# Patient Record
Sex: Female | Born: 1980 | Hispanic: No | Marital: Married | Smoking: Never smoker
Health system: Southern US, Community
[De-identification: ages and names within clinical notes are randomized; demographics above are authoritative.]

## PROBLEM LIST (undated history)

## (undated) DIAGNOSIS — Z789 Other specified health status: Secondary | ICD-10-CM

## (undated) HISTORY — PX: NO PAST SURGERIES: SHX2092

---

## 2010-10-20 HISTORY — PX: WISDOM TOOTH EXTRACTION: SHX21

## 2014-04-30 ENCOUNTER — Encounter (HOSPITAL_COMMUNITY): Payer: Self-pay | Admitting: *Deleted

## 2014-04-30 ENCOUNTER — Inpatient Hospital Stay (HOSPITAL_COMMUNITY): Payer: BC Managed Care – PPO

## 2014-04-30 ENCOUNTER — Inpatient Hospital Stay (HOSPITAL_COMMUNITY)
Admission: AD | Admit: 2014-04-30 | Discharge: 2014-04-30 | Disposition: A | Discharge status: Home / Self Care | Payer: BC Managed Care – PPO | Source: Ambulatory Visit | Attending: Obstetrics & Gynecology | Admitting: Obstetrics & Gynecology

## 2014-04-30 DIAGNOSIS — O2390 Unspecified genitourinary tract infection in pregnancy, unspecified trimester: Secondary | ICD-10-CM | POA: Diagnosis not present

## 2014-04-30 DIAGNOSIS — O039 Complete or unspecified spontaneous abortion without complication: Secondary | ICD-10-CM | POA: Insufficient documentation

## 2014-04-30 DIAGNOSIS — N390 Urinary tract infection, site not specified: Secondary | ICD-10-CM | POA: Diagnosis not present

## 2014-04-30 LAB — URINALYSIS, ROUTINE W REFLEX MICROSCOPIC
BILIRUBIN URINE: NEGATIVE
Glucose, UA: 100 mg/dL — AB
KETONES UR: 40 mg/dL — AB
Nitrite: POSITIVE — AB
PH: 7 (ref 5.0–8.0)
Protein, ur: >300 mg/dL — AB
SPECIFIC GRAVITY, URINE: 1.015 (ref 1.005–1.030)
Urobilinogen, UA: >8 mg/dL — ABNORMAL HIGH (ref 0.0–1.0)

## 2014-04-30 LAB — URINE MICROSCOPIC-ADD ON

## 2014-04-30 LAB — CBC
HEMATOCRIT: 32.7 % — AB (ref 36.0–46.0)
Hemoglobin: 11 g/dL — ABNORMAL LOW (ref 12.0–15.0)
MCH: 28.4 pg (ref 26.0–34.0)
MCHC: 33.6 g/dL (ref 30.0–36.0)
MCV: 84.5 fL (ref 78.0–100.0)
Platelets: 391 10*3/uL (ref 150–400)
RBC: 3.87 MIL/uL (ref 3.87–5.11)
RDW: 13.3 % (ref 11.5–15.5)
WBC: 12.4 10*3/uL — ABNORMAL HIGH (ref 4.0–10.5)

## 2014-04-30 LAB — POCT PREGNANCY, URINE: Preg Test, Ur: POSITIVE — AB

## 2014-04-30 LAB — HCG, QUANTITATIVE, PREGNANCY: hCG, Beta Chain, Quant, S: 5560 m[IU]/mL — ABNORMAL HIGH (ref <5)

## 2014-04-30 LAB — ABO/RH: ABO/RH(D): AB POS

## 2014-04-30 MED ORDER — OXYCODONE-ACETAMINOPHEN 5-325 MG PO TABS
2.0000 | ORAL_TABLET | ORAL | Status: DC | PRN
Start: 2014-04-30 — End: 2014-04-30

## 2014-04-30 MED ORDER — PROMETHAZINE HCL 25 MG PO TABS: 25.0000 mg | ORAL_TABLET | Freq: Four times a day (QID) | ORAL | Status: DC | PRN

## 2014-04-30 MED ORDER — KETOROLAC TROMETHAMINE 60 MG/2ML IM SOLN
60.0000 mg | Freq: Once | INTRAMUSCULAR | Status: AC
  Administered 2014-04-30: 60 mg via INTRAMUSCULAR
  Filled 2014-04-30: qty 2

## 2014-04-30 MED ORDER — CEPHALEXIN 500 MG PO CAPS: 500.0000 mg | ORAL_CAPSULE | Freq: Four times a day (QID) | ORAL | Status: DC

## 2014-04-30 MED ORDER — OXYCODONE-ACETAMINOPHEN 5-325 MG PO TABS: 2.0000 | ORAL_TABLET | ORAL | Status: DC | PRN

## 2014-04-30 NOTE — MAU Provider Note (Signed)
History     CSN: [id number]  Arrival date and time: 04/30/14 1014   First Provider Initiated Contact with Patient 04/30/14 1124      Chief Complaint  Patient presents with  . Vaginal Bleeding   HPI Comments: Valerie Hansen 33 y.o. G1P0  [redacted] weeks pregnant presents to MAU with vaginal bleeding off and on since last Friday. Today the bleeding became very heavy and she was changing a pad every 15 minutes. She had a fainting episode in [place] and was put in room. IV started, BP 88/60 and she was diaphoretic. She denies pain.      History reviewed. No pertinent past medical history.  History reviewed. No pertinent past surgical history.  History reviewed. No pertinent family history.  History  Substance Use Topics  . Smoking status: Never Smoker   . Smokeless tobacco: Never Used  . Alcohol Use: No    Allergies: Allergies not on file  No prescriptions prior to admission    Review of Systems  Constitutional: Positive for malaise/fatigue and diaphoresis.  HENT: Negative.   Eyes: Negative.   Respiratory: Negative.   Cardiovascular: Negative.   Gastrointestinal: Negative.   Genitourinary: Negative.        Vaginal bleeding  Musculoskeletal: Negative.   Neurological: Positive for weakness.  Psychiatric/Behavioral: Negative.    Physical Exam   Blood pressure 108/84, pulse 125, temperature 98.1 F (36.7 C), temperature source Oral, resp. rate 18, height 5\' 1"  (1.549 m), weight 70.761 kg (156 lb), last menstrual period 02/21/2014.  Physical Exam  Constitutional: She is oriented to person, place, and time. She appears well-developed and well-nourished.  Fainted, diaphoretic,   Respiratory: Effort normal and breath sounds normal.  GI: Soft. Bowel sounds are normal.  Genitourinary:  Cervix: os open passing POC Stopped exam to give pain medications  After Toradol large clots and bleeding Os open 1 cm  2:45 os open / blood less  Musculoskeletal: Normal range of motion.   Neurological: She is alert and oriented to person, place, and time.  Skin: Skin is warm and dry.  Psychiatric: She has a normal mood and affect. Her behavior is normal. Judgment and thought content normal.   Results for orders placed during the hospital encounter of 04/30/14 (from the past 24 hour(s))  URINALYSIS, ROUTINE W REFLEX MICROSCOPIC     Status: Abnormal   Collection Time    04/30/14 10:45 AM      Result Value Ref Range   Color, Urine RED (*) YELLOW   APPearance TURBID (*) CLEAR   Specific Gravity, Urine 1.015  1.005 - 1.030   pH 7.0  5.0 - 8.0   Glucose, UA 100 (*) NEGATIVE mg/dL   Hgb urine dipstick LARGE (*) NEGATIVE   Bilirubin Urine NEGATIVE  NEGATIVE   Ketones, ur 40 (*) NEGATIVE mg/dL   Protein, ur >409 (*) NEGATIVE mg/dL   Urobilinogen, UA >8.1 (*) 0.0 - 1.0 mg/dL   Nitrite POSITIVE (*) NEGATIVE   Leukocytes, UA MODERATE (*) NEGATIVE  URINE MICROSCOPIC-ADD ON     Status: None   Collection Time    04/30/14 10:45 AM      Result Value Ref Range   WBC, UA 7-10  <3 WBC/hpf   RBC / HPF TOO NUMEROUS TO COUNT  <3 RBC/hpf   Bacteria, UA RARE  RARE   Urine-Other MUCOUS PRESENT    POCT PREGNANCY, URINE     Status: Abnormal   Collection Time    04/30/14 10:52 AM  Result Value Ref Range   Preg Test, Ur POSITIVE (*) NEGATIVE  CBC     Status: Abnormal   Collection Time    04/30/14 11:00 AM      Result Value Ref Range   WBC 12.4 (*) 4.0 - 10.5 K/uL   RBC 3.87  3.87 - 5.11 MIL/uL   Hemoglobin 11.0 (*) 12.0 - 15.0 g/dL   HCT 78.2 (*) 95.6 - 21.3 %   MCV 84.5  78.0 - 100.0 fL   MCH 28.4  26.0 - 34.0 pg   MCHC 33.6  30.0 - 36.0 g/dL   RDW 08.6  57.8 - 46.9 %   Platelets 391  150 - 400 K/uL  HCG, QUANTITATIVE, PREGNANCY     Status: Abnormal   Collection Time    04/30/14 11:00 AM      Result Value Ref Range   hCG, Beta Chain, Quant, S 5560 (*) <5 mIU/mL  ABO/RH     Status: None   Collection Time    04/30/14 11:00 AM      Result Value Ref Range   ABO/RH(D) AB  POS       US Ob Comp Less 14 Wks  04/30/2014   CLINICAL DATA:  Heavy bleeding. Beta HCG wall 5,500. Estimated gestational age by last menstrual period equals 9 weeks 5 days.  EXAM: OBSTETRIC <14 WK [place] AND TRANSVAGINAL OB US  TECHNIQUE: Both transabdominal and transvaginal ultrasound examinations were performed for complete evaluation of the gestation as well as the maternal uterus, adnexal regions, and pelvic cul-de-sac. Transvaginal technique was performed to assess early pregnancy.  COMPARISON:  None.  FINDINGS: Intrauterine gestational sac: Not identified  Yolk sac:  Not identified  Embryo:  Not identified  Maternal uterus/adnexae: There several fluid collections within the endometrial canal and some are complex echogenic foci within the endometrial canal suggesting products of conception.  Ovaries are normal.  No free fluid.  IMPRESSION: 1. Findings are most suggestive of spontaneous abortion in progress. There is no intrauterine gestational sac identified. There is fluid and some echogenic material within the endometrial canal suggesting products of conception. 2. Ovaries are normal. 3. No free fluid.   Electronically Signed   By: Genevive Bi M.D.   On: 04/30/2014 13:37   US Ob Transvaginal  04/30/2014   CLINICAL DATA:  Heavy bleeding. Beta HCG wall 5,500. Estimated gestational age by last menstrual period equals 9 weeks 5 days.  EXAM: OBSTETRIC <14 WK [place] AND TRANSVAGINAL OB US  TECHNIQUE: Both transabdominal and transvaginal ultrasound examinations were performed for complete evaluation of the gestation as well as the maternal uterus, adnexal regions, and pelvic cul-de-sac. Transvaginal technique was performed to assess early pregnancy.  COMPARISON:  None.  FINDINGS: Intrauterine gestational sac: Not identified  Yolk sac:  Not identified  Embryo:  Not identified  Maternal uterus/adnexae: There several fluid collections within the endometrial canal and some are complex echogenic foci within the  endometrial canal suggesting products of conception.  Ovaries are normal.  No free fluid.  IMPRESSION: 1. Findings are most suggestive of spontaneous abortion in progress. There is no intrauterine gestational sac identified. There is fluid and some echogenic material within the endometrial canal suggesting products of conception. 2. Ovaries are normal. 3. No free fluid.   Electronically Signed   By: Genevive Bi M.D.   On: 04/30/2014 13:37     MAU Course  Procedures  MDM Wet prep, GC, Chlamydia, CBC, UA, U/S, ABORh, Quant Toradol 60 mg IM/  only has pain with exam 2 liters LR Urine culture [place] take [place] there for the week   Assessment and Plan   A: SAB UTI  P: Keflex 500 mg QID X 7 days Percocet/ phenergan Fluids/ rest Note for work for week Return to Hospital for excessive pain/ bleeding more than pad/ hour  Carolynn ServeBarefoot, Yvon Mccord Miller 04/30/2014, 11:24 AM

## 2014-04-30 NOTE — Discharge Instructions (Signed)
Urinary Tract Infection Urinary tract infections (UTIs) can develop anywhere along your urinary tract. Your urinary tract is your body's drainage system for removing wastes and extra water. Your urinary tract includes two kidneys, two ureters, a bladder, and a urethra. Your kidneys are a pair of bean-shaped organs. Each kidney is about the size of your fist. They are located below your ribs, one on each side of your spine. CAUSES Infections are caused by microbes, which are microscopic organisms, including fungi, viruses, and bacteria. These organisms are so small that they can only be seen through a microscope. Bacteria are the microbes that most commonly cause UTIs. SYMPTOMS  Symptoms of UTIs may vary by age and gender of the patient and by the location of the infection. Symptoms in young women typically include a frequent and intense urge to urinate and a painful, burning feeling in the bladder or urethra during urination. Older women and men are more likely to be tired, shaky, and weak and have muscle aches and abdominal pain. A fever may mean the infection is in your kidneys. Other symptoms of a kidney infection include pain in your back or sides below the ribs, nausea, and vomiting. DIAGNOSIS To diagnose a UTI, your caregiver will ask you about your symptoms. Your caregiver also will ask to provide a urine sample. The urine sample will be tested for bacteria and white blood cells. White blood cells are made by your body to help fight infection. TREATMENT  Typically, UTIs can be treated with medication. Because most UTIs are caused by a bacterial infection, they usually can be treated with the use of antibiotics. The choice of antibiotic and length of treatment depend on your symptoms and the type of bacteria causing your infection. HOME CARE INSTRUCTIONS  If you were prescribed antibiotics, take them exactly as your caregiver instructs you. Finish the medication even if you feel better after you  have only taken some of the medication.  Drink enough water and fluids to keep your urine clear or pale yellow.  Avoid caffeine, tea, and carbonated beverages. They tend to irritate your bladder.  Empty your bladder often. Avoid holding urine for long periods of time.  Empty your bladder before and after sexual intercourse.  After a bowel movement, women should cleanse from front to back. Use each tissue only once. SEEK MEDICAL CARE IF:   You have back pain.  You develop a fever.  Your symptoms do not begin to resolve within 3 days. SEEK IMMEDIATE MEDICAL CARE IF:   You have severe back pain or lower abdominal pain.  You develop chills.  You have nausea or vomiting.  You have continued burning or discomfort with urination. MAKE SURE YOU:   Understand these instructions.  Will watch your condition.  Will get help right away if you are not doing well or get worse. Document Released: 07/16/2005 Document Revised: 04/06/2012 Document Reviewed: 11/14/2011 [facility] Patient Information 2015 [place]. This information is not intended to replace advice given to you by your health care provider. Make sure you discuss any questions you have with your health care provider. Miscarriage A miscarriage is the sudden loss of an unborn baby (fetus) before the 20th week of pregnancy. Most miscarriages happen in the first 3 months of pregnancy. Sometimes, it happens before a woman even knows she is pregnant. A miscarriage is also called a "spontaneous miscarriage" or "early pregnancy loss." Having a miscarriage can be an emotional experience. Talk with your caregiver about any questions you  may have about miscarrying, the grieving process, and your future pregnancy plans. CAUSES   Problems with the fetal chromosomes that make it impossible for the baby to develop normally. Problems with the baby's genes or chromosomes are most often the result of errors that occur, by chance, as the embryo  divides and grows. The problems are not inherited from the parents.  Infection of the cervix or uterus.   Hormone problems.   Problems with the cervix, such as having an incompetent cervix. This is when the tissue in the cervix is not strong enough to hold the pregnancy.   Problems with the uterus, such as an abnormally shaped uterus, uterine fibroids, or congenital abnormalities.   Certain medical conditions.   Smoking, drinking alcohol, or taking illegal drugs.   Trauma.  Often, the cause of a miscarriage is unknown.  SYMPTOMS   Vaginal bleeding or spotting, with or without cramps or pain.  Pain or cramping in the abdomen or lower back.  Passing fluid, tissue, or blood clots from the vagina. DIAGNOSIS  Your caregiver will perform a physical exam. You may also have an ultrasound to confirm the miscarriage. Blood or urine tests may also be ordered. TREATMENT   Sometimes, treatment is not necessary if you naturally pass all the fetal tissue that was in the uterus. If some of the fetus or placenta remains in the body (incomplete miscarriage), tissue left behind may become infected and must be removed. Usually, a dilation and curettage (D and C) procedure is performed. During a D and C procedure, the cervix is widened (dilated) and any remaining fetal or placental tissue is gently removed from the uterus.  Antibiotic medicines are prescribed if there is an infection. Other medicines may be given to reduce the size of the uterus (contract) if there is a lot of bleeding.  If you have Rh negative blood and your baby was Rh positive, you will need a Rh immunoglobulin shot. This shot will protect any future baby from having Rh blood problems in future pregnancies. HOME CARE INSTRUCTIONS   Your caregiver may order bed rest or may allow you to continue light activity. Resume activity as directed by your caregiver.  Have someone help with home and family responsibilities during this  time.   Keep track of the number of sanitary pads you use each day and how soaked (saturated) they are. Write down this information.   Do not use tampons. Do not douche or have sexual intercourse until approved by your caregiver.   Only take over-the-counter or prescription medicines for pain or discomfort as directed by your caregiver.   Do not take aspirin. Aspirin can cause bleeding.   Keep all follow-up appointments with your caregiver.   If you or your partner have problems with grieving, talk to your caregiver or seek counseling to help cope with the pregnancy loss. Allow enough time to grieve before trying to get pregnant again.  SEEK IMMEDIATE MEDICAL CARE IF:   You have severe cramps or pain in your back or abdomen.  You have a fever.  You pass large blood clots (walnut-sized or larger) ortissue from your vagina. Save any tissue for your caregiver to inspect.   Your bleeding increases.   You have a thick, bad-smelling vaginal discharge.  You become lightheaded, weak, or you faint.   You have chills.  MAKE SURE YOU:  Understand these instructions.  Will watch your condition.  Will get help right away if you are not doing well  or get worse. Document Released: 04/01/2001 Document Revised: 01/31/2013 Document Reviewed: 11/25/2011 [facility] Patient Information 2015 [place], [place]. This information is not intended to replace advice given to you by your health care provider. Make sure you discuss any questions you have with your health care provider.

## 2014-04-30 NOTE — MAU Note (Signed)
Woke up with blood in bed this morning, more when went to restroom.  Not time of period, had +HPT in June

## 2014-05-01 LAB — CULTURE, OB URINE
COLONY COUNT: NO GROWTH
Culture: NO GROWTH

## 2014-05-08 ENCOUNTER — Ambulatory Visit (INDEPENDENT_AMBULATORY_CARE_PROVIDER_SITE_OTHER): Payer: BC Managed Care – PPO | Admitting: Obstetrics & Gynecology

## 2014-05-08 ENCOUNTER — Encounter: Payer: Self-pay | Admitting: Obstetrics & Gynecology

## 2014-05-08 VITALS — BP 108/71 | HR 100 | Temp 98.5°F | Wt 161.5 lb

## 2014-05-08 DIAGNOSIS — O039 Complete or unspecified spontaneous abortion without complication: Secondary | ICD-10-CM

## 2014-05-08 NOTE — Progress Notes (Signed)
   Subjective:    Patient ID: Valerie Hansen, female    DOB: 05/11/1981, 33 y.o.   MRN: [id number]  HPI  33 yo [place] M G1P0A1 is here for follow up after a MAU visit 04-30-14 where she was diagnosed with a complete miscarriage. She bled heavily for 3 days but now only has some spotting. She denies any pain. She was treated for a UTI at the MAU also. She says that she has no dysuria but did not finish quite all of her antibiotics.  Review of Systems [place]. [place] is now back in [place] and she is filling out paperwork to have him come to the [place]. But he will not be here for quite some time so she says that she does not need contraception.    Objective:   Physical Exam  No CVAT     Assessment & Plan:  Preventative care- RTC 6 weeks for annual/pap Miscarriage- [facility] and in 6 weeks

## 2014-05-09 LAB — HCG, QUANTITATIVE, PREGNANCY: hCG, Beta Chain, Quant, S: 5497.2 m[IU]/mL

## 2014-05-15 ENCOUNTER — Telehealth: Payer: Self-pay | Admitting: *Deleted

## 2014-05-15 ENCOUNTER — Encounter: Payer: Self-pay | Admitting: *Deleted

## 2014-05-15 DIAGNOSIS — O039 Complete or unspecified spontaneous abortion without complication: Secondary | ICD-10-CM

## 2014-05-15 NOTE — Telephone Encounter (Signed)
Message copied by Dorothyann PengHAIZLIP, Isaia Hassell E on Mon May 15, 2014  1:06 PM ------      Message from: Nicholaus BloomVE, MYRA C      Created: Mon May 15, 2014  9:55 AM       She needs an u/s, a repeat QBHCG, and  an appt in the gyn clinic with any provider asap.      Thanks. ------

## 2014-05-15 NOTE — Telephone Encounter (Signed)
Pt has an ultrasound appointment on May 18, 2014 @ 1115.   Attempted to contact patient with the aide of [employer], unable to reach patient, unable to leave voicemail.  Will send certified letter with appointment date/time.  Letter sent.

## 2014-05-17 ENCOUNTER — Encounter: Payer: Self-pay | Admitting: General Practice

## 2014-05-18 ENCOUNTER — Other Ambulatory Visit: Payer: BC Managed Care – PPO

## 2014-05-18 ENCOUNTER — Ambulatory Visit (HOSPITAL_COMMUNITY): Payer: BC Managed Care – PPO

## 2014-05-18 DIAGNOSIS — O039 Complete or unspecified spontaneous abortion without complication: Secondary | ICD-10-CM

## 2014-05-19 LAB — HCG, QUANTITATIVE, PREGNANCY: HCG, BETA CHAIN, QUANT, S: 2440.7 m[IU]/mL

## 2014-05-22 ENCOUNTER — Telehealth: Payer: Self-pay | Admitting: *Deleted

## 2014-05-22 NOTE — Telephone Encounter (Signed)
Message copied by Dorothyann PengHAIZLIP, Tahja Liao E on Mon May 22, 2014  8:43 AM ------      Message from: CONSTANT, Gigi GinPEGGY      Created: Fri May 19, 2014  7:32 AM       Patient needs to return next week for repeat quant HCG ------

## 2014-05-22 NOTE — Telephone Encounter (Signed)
Contacted patient, discussed results and lab appointment scheduled for Thursday August 6,2015 @ 0900 for bhcg.

## 2014-05-25 ENCOUNTER — Other Ambulatory Visit: Payer: BC Managed Care – PPO

## 2014-05-25 DIAGNOSIS — O039 Complete or unspecified spontaneous abortion without complication: Secondary | ICD-10-CM

## 2014-05-25 LAB — HCG, QUANTITATIVE, PREGNANCY: HCG, BETA CHAIN, QUANT, S: 2268.6 m[IU]/mL

## 2014-06-16 ENCOUNTER — Encounter: Payer: Self-pay | Admitting: General Practice

## 2014-06-19 ENCOUNTER — Encounter: Payer: Self-pay | Admitting: Obstetrics & Gynecology

## 2014-06-19 ENCOUNTER — Ambulatory Visit (INDEPENDENT_AMBULATORY_CARE_PROVIDER_SITE_OTHER): Payer: BC Managed Care – PPO | Admitting: Obstetrics & Gynecology

## 2014-06-19 VITALS — BP 104/63 | HR 76 | Wt 155.5 lb

## 2014-06-19 DIAGNOSIS — Z01419 Encounter for gynecological examination (general) (routine) without abnormal findings: Secondary | ICD-10-CM

## 2014-06-19 NOTE — Patient Instructions (Signed)
Prenatal Vitamin and Mineral Combinations (oral solid dosage forms) What is this medicine? PRENATAL VITAMIN AND MINERAL combinations are used before, during, and after pregnancy to help provide provide good nutrition. This medicine may be used for other purposes; ask your health care provider or pharmacist if you have questions. COMMON BRAND NAME(S): Active OB, Advanced Care Plus, Advanced NatalCare, Advanced-RF NatalCare, Aminate Fe, Anemagen OB, B-Nexa, BP FoliNatal Plus B, BP MultiNatal Plus, BP MultiNatal Plus Chewable, BP Prenate, Brainstrong, Bright Beginnings Prenatal, Cal-Nate, Calcium PNV, CareNatal DHA, CareNate 600, Cavan One Omega, Cavan Prenatal with EC Calcium, Cavan-Alpha, Cavan-EC SOD DHA, Cavan-Heme OB, Cavan-Heme Omega, Cenogen Ultra, Centrum Specialist Prenatal, CertaVite with Antioxidants, Choice-OB + DHA, Citracal Prenatal, Citracal Prenatal + DHA, CitraNatal 90 DHA, CitraNatal Assure, CitraNatal B-Calm, CitraNatal DHA, CitraNatal Harmony, CitraNatal Rx, Classic Prenatal, ComBi Rx, Complete Natal DHA, Complete-RF, CompleteNate, Concept DHA, Concept OB, CoreNate-DHA, Corvite FE with Quatrefolic, CRNatal DHA, Daily Vitamin, Docosavit, Duet, Duet Chewable, Duet DHA, Duet DHA 400, Duet DHA 430ec, Duet DHA Balanced, Duet DHA Complete, Duet DHA Complete Gluten Free, Duet DHA EC, Duet DHA Ferrazone, EC Omega-3, DuoVit DHA, Edge OB, Elite OB, Elite OB with DHA, Elite-OB 400, Extra-Virt Plus, Femecal OB, Femecal OB Plus DHA, Ferrocite Plus, Folbecal, Folcal DHA, Folcaps Care One, Folcaps Omega 3, Folet One with Quatrefolic, Folivane OB, Folivane-EC Calcium DHA NF, Foltabs 90 Plus DHA, Foltabs Prenatal, Foltabs Prenatal Plus DHA, Gesticare, Gesticare DHA, Gesticare DHA Delayed-Release, HemeNatal OB, HemeNatal OB + DHA, Hemocyte Plus, HIP Prenatal, ICAR Prenatal Rx, iNatal Advance, iNatal GT, iNatal Ultra, Infanate Balance, Infanate DHA, Infanate Plus, Kolnatal, Lactocal-F, Levomefolate PNV, MACNATAL  CN DHA, Marnatal-F, Marnatal-F Plus, Materna, Maxinate, Mission Prenatal, Mission Prenatal F.A., Mission Prenatal H.P., Mom's Choice Rx, Multi-Nate 30, Multi-Nate 30 DHA, Multi-Nate DHA Extra, Multifol Plus, Multivitamin With Minerals, MyNatal OB Prenatal, NataCaps, NataChew, NataFolic-OB, Natafort, Natal-V RX, NatalCare CFe 60, NatalCare GlossTabs, NatalCare PIC, NatalCare PIC Forte, NatalCare Plus, NatalCare Rx, NatalCare Three, NATALVIRT 90 DHA, NATALVIRT CA, NataTab CFe, NataTab FA, Natatab Rx, Natelle, Natelle C, Natelle One, Natelle Plus with DHA, Natelle Prefer, Natelle-ez, Navatab + DHA, Neevo, Neevo DHA, Nestabs, Nestabs ABC, Nestabs CBF, Nestabs DHA, Nestabs FA, Nestabs Rx, New Advanced Formula Prenatal Z, Nexa Plus, Nexa Select, Niferex-PN, Niferex-PN Forte, NovaNatal, NovaStart, Nu-Natal, NutraCare, Nutri-Tab OB, Nutri-Tab OB + DHA, Nutrinate, NutriSpire, O-Cal F.A., O-Cal Prenatal, OB Choice, OB Complete, OB Complete 400, OB Complete One, OB Complete Petite, OB Complete Premier, OB Complete with DHA, OB-Natal One, Obstetrix-100, Obtrex, Obtrex DHA, One-A-Day Women's, One-A-Day Women's Prenatal, OptiNate, Paire OB Tablet Plus DHA, PNV OB + DHA, PNV Prenatal, PNV Prenatal Plus Multivitamin, PNV Tabs 29-1, PNV-DHA, PNV-DHA + Docusate, PNV-DHA Plus, PNV-First, PNV-Iron, PNV-OB with DHA, PNV-Omega, PNV-Select, PNV-Total with DHA, PNV-VP-U, PR Natal 400, PR Natal 400ec, PR Natal 430, PR Natal 430ec, PR Natal 440ec, PreCare, PreferaOB, PreferaOB + DHA, PreferaOB One, Premesis Rx, Prena1 PEARL, Prena1 Plus, PrenaCare, Prenafirst, Prenaissance, Prenaissance 90 DHA, Prenaissance Balance, Prenaissance DHA, Prenaissance Harmony DHA, Prenaissance Next, Prenaissance Plus, Prenaissance Promise, PrenaPlus, Prenat with Quatrefolic, PreNata Multivitamin with Iron, Prenatabs CBF, Prenatabs FA, Prenatabs OBN, Prenatabs RX, Prenatal, Prenatal 1 Plus 1, Prenatal 19, Prenatal AD, Prenatal Formula 3, Prenatal H, Prenatal Low  Iron, Prenatal MR 90 Fe, Prenatal MTR with Selenium, Prenatal Multivitamin + DHA 2, Prenatal Optima Advance, Prenatal Plus, Prenatal Plus Iron, Prenatal Plus Low Iron, Prenatal Rx with Beta Carotene, Prenatal S, Prenatal U, Prenatal Vitamin, PreNatal Vitamins Plus, Prenate Advance, Prenate AM with Quatrefolic, Prenate DHA, Prenate Elite, Prenate   Enhance with Quatrefolic, Prenate Essential, Prenate GT, Prenate Mini, PreNate Plus, Prenate Restore with Quatrefolic, Prenate Star, Prenate Ultra, Prenavite, Prenavite Protein, PreNexa, PreNexa Premier, PrePLUS, PreQue, PreTAB, Previte Rx, PrimaCare, PrimaCare Advantage, PrimaCare ONE, Provida OB, PruEt DHA, PruEt DHAec, PureFe OB Plus, PureFe Plus, PureVit DualFe Plus, RE DualVit OB, RE DualVit Plus, RE OB + DHA, RE OB 90 + DHA, RE Prenatal, RE PreVit + DHA, RE-Nata 29, RE-Nata 29 OB, Reaphrim, Renate, Renate DHA, Renate DHA Extra, REocyte Plus, Right Step, Rovin-Nv, Rovin-Nv DHA, Se-Care, Se-Care Conceive, Se-Care Gesture, Se-Natal 19, Se-Natal 19 Chewable, Se-Natal 90, Se-Natal ONE, Se-Plete DHA, Se-Tan DHA, Se-Tan Plus, Select-OB, Select-OB + DHA, SetonET, SetonET-EC DHA, StrongStart, StrongStart Chewable Tablet, Stuart One, Stuart Prenatal, Stuart Prenatal + DHA, Stuartnatal Plus 3, Tandem DHA, Tandem OB, Tandem Plus, Taron A Prenatal Pack with DHA, Taron EC Calcium DHA Pack, Taron Prenatal with DHA, Taron-C DHA, Taron-EC Cal, Taron-Prex Prenatal with DHA, Thera Natal Complete, Thera Natal Core Nutrition, Thera Natal Lactation Support, Thera Natal OvaVite, Thera Natal Plus, Thera-Tabs, TL-Care DHA, TL-Select, TL-Select DHA, Tri Rx, TriAdvance, TriCare, TriCare Prenatal DHA ONE, Trifera OB, Trimesis Rx, Trinatal GT, Trinatal Rx 1, Trinate, Triveen-Duo DHA, Triveen-PRx RNF, Triveen-Ten, Trust Natal DHA, UltimateCare Advantage, UltimateCare Combo, UltimateCare ONE, UltimateCare ONE NF, Ultra NatalCare, VemaVite-PRx 2, Vena-Bal DHA, Venatal-FA, Verotin-BY, Verotin-GR,  Vinacal, Vinacal B, Vinatal Forte, Vinate 90, Vinate AZ, Vinate AZ Extra, Vinate C, Vinate Calcium, Vinate Care, Vinate DHA, Vinate GT, Vinate IC, Vinate II, Vinate III, Vinate M Low Iron, Vinate One, Vinate PN, Vinate Ultra, Virt Nate, Virt-Advance, Virt-C DHA, Virt-Care One, Virt-PN, Virt-PN DHA, Virt-PN Plus, Virt-Select, Virt-Vite GT, VirtPrex, Vitafol PN, Vitafol Ultra, Vitafol-Nano Prenatal, Vitafol-OB, Vitafol-OB + DHA, Vitafol-OB and DHA, Vitafol-One, VitaMed MD Plus Rx, VitaNatal OB Plus DHA, vitaPearl Prenatal, VitaPhil, VitaPhil + DHA, VitaPhil + DHA 90, VitaPhil AiDE, VitaSpire, Viva CT, VIVA DHA, Vol-Tab Rx, VP CH Ultra, VP-CH-PNV, VP-GGR-B6 Prenatal, VP-HEME OB, VP-HEME OB+ DHA, VP-PNV-DHA, Zatean-CH, Zatean-Pn, Zatean-Pn DHA, Zatean-Pn Plus, Zingiber What should I tell my health care provider before I take this medicine? They need to know if you have any of these conditions: -bleeding or clotting disorder -history of anemia of any type -other chronic health condition -an unusual or allergic reaction to vitamins, minerals, other medicines, foods, dyes, or preservatives How should I use this medicine? Take this medicine by mouth with a glass of water. You can take it with or without food. If it upsets your stomach, take it with food. Chewable prenatal vitamin tablets may be chewed completely before swallowing. Follow the directions on the prescription label. The usual dose is taken once a day. Do not take your medicine more often than directed. Contact your pediatrician regarding the use of this medicine in children. Special care may be needed. This medicine is intended for females who are pregnant, breast-feeding, or may become pregnant. Overdosage: If you think you have taken too much of this medicine contact a poison control center or emergency room at once. NOTE: This medicine is only for you. Do not share this medicine with others. What if I miss a dose? If you miss a dose, take it as  soon as you can. If it is almost time for your next dose, take only that dose. Do not take double or extra doses. What may interact with this medicine? -alendronate -antacids -cefdinir -cefditoren -etidronate -fluoroquinolone antibiotics (examples: ciprofloxacin, gatifloxacin, levofloxacin) -ibandronate -levodopa -risedronate -tetracycline antibiotics (examples: doxycycline, minocycline, tetracycline) -thyroid hormones -warfarin This list may not describe   all possible interactions. Give your health care provider a list of all the medicines, herbs, non-prescription drugs, or dietary supplements you use. Also tell them if you smoke, drink alcohol, or use illegal drugs. Some items may interact with your medicine. What should I watch for while using this medicine? See your health care professional for regular checks on your progress. Remember that vitamin and mineral supplements do not replace the need for good nutrition from a balanced diet. Stools commonly change color when vitamins and minerals are taken. Notify your health care professional if this change is alarming or accompanied by other symptoms, like abdominal pain. What side effects may I notice from receiving this medicine? Side effects that you should report to your doctor or health care professional as soon as possible: -allergic reaction such as skin rash or difficulty breathing -vomiting Side effects that usually do not require medical attention (report to your doctor or health care professional if they continue or are bothersome): -nausea -stomach upset This list may not describe all possible side effects. Call your doctor for medical advice about side effects. You may report side effects to FDA at 1-800-FDA-1088. Where should I keep my medicine? Keep out of the reach of children. Most vitamins and minerals should be stored at controlled room temperature. Check your specific product directions. Protect from heat and moisture.  Throw away any unused medicine after the expiration date. NOTE: This sheet is a summary. It may not cover all possible information. If you have questions about this medicine, talk to your doctor, pharmacist, or health care provider.  2015, Elsevier/Gold Standard. (2014-01-31 08:34:45)  

## 2014-06-19 NOTE — Progress Notes (Signed)
Here to followup on SAB needs labs and for a pap smear.

## 2014-06-19 NOTE — Addendum Note (Signed)
Addended by: Kathee Delton on: 06/19/2014 02:37 PM   Modules accepted: Orders

## 2014-06-19 NOTE — Progress Notes (Signed)
Patient ID: Valerie Hansen, female   DOB: October 31, 1980, 33 y.o.   MRN: [id number] Subjective:     Valerie Hansen is a 33 y.o. female here for a routine exam.  Current complaints: none.  Pt reports SAB in July. She had a normal cycle that was heavier than usual in Aug.  She reports no further complaints. No irreg bleeding or pain.   Gynecologic History Patient's last menstrual period was 02/21/2014. Contraception: abstinence (her [place] is out of the country) Last Pap: 2 years prev. Results were: normal Last mammogram: n/a.   Obstetric History OB History  Gravida Para Term Preterm AB SAB TAB Ectopic Multiple Living  0    # Outcome Date GA Lbr Len/2nd Weight Sex Delivery Anes PTL Lv  1 SAB                The following portions of the patient's history were reviewed and updated as appropriate: allergies, current medications, past family history, past medical history, past social history, past surgical history and problem list.  Review of Systems A comprehensive review of systems was negative.    Objective:    BP 104/63  Pulse 76  Wt 155 lb 8 oz (70.534 kg)  LMP 02/21/2014  Breastfeeding? Unknown  General Appearance:    Alert, cooperative, no distress, appears stated age  Head:    Normocephalic, without obvious abnormality, atraumatic           Throat:   Lips, mucosa, and tongue normal; teeth and gums normal  Neck:   Supple, symmetrical, trachea midline, no adenopathy;    thyroid:  no enlargement/tenderness/nodules; no carotid   bruit or JVD  Back:     Symmetric, no curvature, ROM normal, no CVA tenderness  Lungs:     Clear to auscultation bilaterally, respirations unlabored  Chest Wall:    No tenderness or deformity   Heart:    Regular rate and rhythm, S1 and S2 normal, no murmur, rub   or gallop  Breast Exam:    No tenderness, masses, or nipple abnormality  Abdomen:     Soft, non-tender, bowel sounds active all four quadrants,    no masses, no organomegaly   Genitalia:    Normal female without lesion, discharge or tenderness; uterus: small- mobile       Extremities:   Extremities normal, atraumatic, no cyanosis or edema  Pulses:   2+ and symmetric all extremities  Skin:   Skin color, texture, turgor normal, no rashes or lesions            Assessment:    Healthy female exam.  H/o SAB in July- no further eval needed   Plan:    Follow up in: 1 year.   PNV 3 months prior to attempting to conceive

## 2014-06-20 LAB — CYTOLOGY - PAP

## 2014-08-22 ENCOUNTER — Encounter: Payer: Self-pay | Admitting: Obstetrics & Gynecology

## 2015-08-16 LAB — OB RESULTS CONSOLE GC/CHLAMYDIA
CHLAMYDIA, DNA PROBE: NEGATIVE
GC PROBE AMP, GENITAL: NEGATIVE

## 2015-08-16 LAB — OB RESULTS CONSOLE ABO/RH: RH Type: POSITIVE

## 2015-08-16 LAB — OB RESULTS CONSOLE RUBELLA ANTIBODY, IGM: Rubella: NON-IMMUNE/NOT IMMUNE

## 2015-08-16 LAB — OB RESULTS CONSOLE RPR: RPR: NONREACTIVE

## 2015-08-16 LAB — OB RESULTS CONSOLE ANTIBODY SCREEN: ANTIBODY SCREEN: NEGATIVE

## 2015-08-16 LAB — OB RESULTS CONSOLE HEPATITIS B SURFACE ANTIGEN: HEP B S AG: NEGATIVE

## 2015-08-16 LAB — OB RESULTS CONSOLE HIV ANTIBODY (ROUTINE TESTING): HIV: NONREACTIVE

## 2015-09-14 ENCOUNTER — Other Ambulatory Visit (HOSPITAL_COMMUNITY): Payer: Self-pay | Admitting: Obstetrics

## 2015-09-14 DIAGNOSIS — Z3689 Encounter for other specified antenatal screening: Secondary | ICD-10-CM

## 2015-09-14 DIAGNOSIS — O368190 Decreased fetal movements, unspecified trimester, not applicable or unspecified: Secondary | ICD-10-CM

## 2015-09-19 ENCOUNTER — Encounter (HOSPITAL_COMMUNITY): Payer: Self-pay

## 2015-09-19 ENCOUNTER — Ambulatory Visit (HOSPITAL_COMMUNITY)
Admission: RE | Admit: 2015-09-19 | Discharge: 2015-09-19 | Disposition: A | Discharge status: Home / Self Care | Payer: Medicaid Other | Source: Ambulatory Visit | Attending: Obstetrics | Admitting: Obstetrics

## 2015-09-19 DIAGNOSIS — Z3689 Encounter for other specified antenatal screening: Secondary | ICD-10-CM

## 2015-09-19 DIAGNOSIS — Z36 Encounter for antenatal screening of mother: Secondary | ICD-10-CM | POA: Insufficient documentation

## 2015-09-19 DIAGNOSIS — O99213 Obesity complicating pregnancy, third trimester: Secondary | ICD-10-CM | POA: Insufficient documentation

## 2015-09-19 DIAGNOSIS — O368130 Decreased fetal movements, third trimester, not applicable or unspecified: Secondary | ICD-10-CM | POA: Insufficient documentation

## 2015-09-19 DIAGNOSIS — Z3A30 30 weeks gestation of pregnancy: Secondary | ICD-10-CM | POA: Diagnosis not present

## 2015-09-19 DIAGNOSIS — O09523 Supervision of elderly multigravida, third trimester: Secondary | ICD-10-CM | POA: Diagnosis not present

## 2015-09-19 DIAGNOSIS — O368190 Decreased fetal movements, unspecified trimester, not applicable or unspecified: Secondary | ICD-10-CM

## 2015-09-26 ENCOUNTER — Encounter (HOSPITAL_COMMUNITY): Payer: Self-pay

## 2015-09-26 ENCOUNTER — Ambulatory Visit (HOSPITAL_COMMUNITY)
Admission: RE | Admit: 2015-09-26 | Discharge: 2015-09-26 | Disposition: A | Discharge status: Home / Self Care | Payer: Medicaid Other | Source: Ambulatory Visit | Attending: Obstetrics | Admitting: Obstetrics

## 2015-09-26 DIAGNOSIS — O09523 Supervision of elderly multigravida, third trimester: Secondary | ICD-10-CM | POA: Diagnosis not present

## 2015-09-26 DIAGNOSIS — Z3A28 28 weeks gestation of pregnancy: Secondary | ICD-10-CM | POA: Insufficient documentation

## 2015-09-26 DIAGNOSIS — O368130 Decreased fetal movements, third trimester, not applicable or unspecified: Secondary | ICD-10-CM | POA: Diagnosis present

## 2015-10-02 ENCOUNTER — Other Ambulatory Visit (HOSPITAL_COMMUNITY): Payer: Self-pay | Admitting: Obstetrics

## 2015-10-02 DIAGNOSIS — Z3689 Encounter for other specified antenatal screening: Secondary | ICD-10-CM

## 2015-10-17 ENCOUNTER — Ambulatory Visit (HOSPITAL_COMMUNITY)
Admission: RE | Admit: 2015-10-17 | Discharge: 2015-10-17 | Disposition: A | Discharge status: Home / Self Care | Payer: Medicaid Other | Source: Ambulatory Visit | Attending: Obstetrics | Admitting: Obstetrics

## 2015-10-17 ENCOUNTER — Other Ambulatory Visit (HOSPITAL_COMMUNITY): Payer: Self-pay | Admitting: Obstetrics

## 2015-10-17 ENCOUNTER — Encounter (HOSPITAL_COMMUNITY): Payer: Self-pay

## 2015-10-17 DIAGNOSIS — Z3A34 34 weeks gestation of pregnancy: Secondary | ICD-10-CM | POA: Diagnosis not present

## 2015-10-17 DIAGNOSIS — O09523 Supervision of elderly multigravida, third trimester: Secondary | ICD-10-CM

## 2015-10-17 DIAGNOSIS — Z36 Encounter for antenatal screening of mother: Secondary | ICD-10-CM | POA: Diagnosis not present

## 2015-10-17 DIAGNOSIS — Z3689 Encounter for other specified antenatal screening: Secondary | ICD-10-CM

## 2015-10-17 DIAGNOSIS — O99213 Obesity complicating pregnancy, third trimester: Secondary | ICD-10-CM

## 2015-10-23 LAB — OB RESULTS CONSOLE GBS: GBS: NEGATIVE

## 2015-11-20 ENCOUNTER — Inpatient Hospital Stay (HOSPITAL_COMMUNITY)
Admission: AD | Admit: 2015-11-20 | Discharge: 2015-11-20 | Disposition: A | Discharge status: Home / Self Care | Payer: Medicaid Other | Source: Ambulatory Visit | Attending: Obstetrics | Admitting: Obstetrics

## 2015-11-20 ENCOUNTER — Encounter (HOSPITAL_COMMUNITY): Payer: Self-pay | Admitting: *Deleted

## 2015-11-20 ENCOUNTER — Inpatient Hospital Stay (HOSPITAL_COMMUNITY): Payer: Medicaid Other

## 2015-11-20 DIAGNOSIS — Z3A39 39 weeks gestation of pregnancy: Secondary | ICD-10-CM

## 2015-11-20 DIAGNOSIS — O99213 Obesity complicating pregnancy, third trimester: Secondary | ICD-10-CM

## 2015-11-20 DIAGNOSIS — O368190 Decreased fetal movements, unspecified trimester, not applicable or unspecified: Secondary | ICD-10-CM

## 2015-11-20 DIAGNOSIS — O09523 Supervision of elderly multigravida, third trimester: Secondary | ICD-10-CM | POA: Insufficient documentation

## 2015-11-20 DIAGNOSIS — O368130 Decreased fetal movements, third trimester, not applicable or unspecified: Secondary | ICD-10-CM | POA: Diagnosis not present

## 2015-11-20 HISTORY — DX: Other specified health status: Z78.9

## 2015-11-20 NOTE — MAU Note (Signed)
Sent from office for NST, due to decreased fetal movement

## 2015-11-20 NOTE — MAU Provider Note (Signed)
Chief Complaint:  Decreased Fetal Movement   First Provider Initiated Contact with Patient 11/20/15 1113     HPI: Valerie Hansen is a 35 y.o. G2P0010 at [redacted]w[redacted]d who was sent to maternity admissions from Dr. Elsie Stain office reporting decreased fetal mvmt.  Associated signs and symptoms: Beg for VB, LOF or abd pain  Pregnancy Course: AMA, otherwise uncomplicated  Past Medical History: Past Medical History  Diagnosis Date  . Medical history non-contributory     Past obstetric history: OB History  Gravida Para Term Preterm AB SAB TAB Ectopic Multiple Living  0    # Outcome Date GA Lbr Len/2nd Weight Sex Delivery Anes PTL Lv  2 Current           1 SAB               Past Surgical History: Past Surgical History  Procedure Laterality Date  . No past surgeries       Family History: History reviewed. No pertinent family history.  Social History: Social History  Substance Use Topics  . Smoking status: Never Smoker   . Smokeless tobacco: Never Used  . Alcohol Use: No    Allergies: No Known Allergies  Meds:  Prescriptions prior to admission  Medication Sig Dispense Refill Last Dose  . cephALEXin (KEFLEX) 500 MG capsule Take 1 capsule (500 mg total) by mouth 4 (four) times daily. (Patient not taking: Reported on 09/26/2015) 28 capsule 0 Not Taking  . IRON PO Take by mouth.   Taking  . oxyCODONE-acetaminophen (PERCOCET/ROXICET) 5-325 MG per tablet Take 2 tablets by mouth every 4 (four) hours as needed for moderate pain or severe pain. (Patient not taking: Reported on 09/26/2015) 10 tablet 0 Not Taking  . Prenatal Vit w/Fe-Methylfol-FA (PNV PO) Take by mouth.   Taking  . promethazine (PHENERGAN) 25 MG tablet Take 1 tablet (25 mg total) by mouth every 6 (six) hours as needed for nausea or vomiting. (Patient not taking: Reported on 09/26/2015) 30 tablet 0 Not Taking    I have reviewed patient's Past Medical Hx, Surgical Hx, Family Hx, Social Hx, medications and  allergies.   ROS:  Review of Systems  Gastrointestinal: Negative for abdominal pain.  Genitourinary: Negative for vaginal bleeding.       Neg for LOF    Physical Exam   Patient Vitals for the past 24 hrs:  BP Temp Temp src Pulse Resp  11/20/15 1047 112/77 mmHg 99 F (37.2 C) Oral 93 18   Constitutional: Well-developed, well-nourished female in no acute distress.  Cardiovascular: normal rate Respiratory: normal effort GI: Abd soft, non-tender, gravid, S>D Neurologic: Alert and oriented x 4.   FHT:  Baseline 130 , min-moderate variability, few 15x15 accelerations present, no decelerations Contractions: irreg, mild   Labs: No results found for this or any previous visit (from the past 24 hour(s)).  Imaging:  BPP 8/8, AFI 17  MAU Course: NST, borderline reactive even after position change and drinking juice. Will get BPP.  BPP 8/8. Discussed Hx, NST, BPP w/ Dr. Gaynell Face.    MDM: Decrease fetal mvmt, but fetal status reassuring.    Assessment: 1. Decreased fetal movement   2. [redacted] weeks gestation of pregnancy   3. AMA (advanced maternal age) multigravida 35+, third trimester   4. Obesity complicating pregnancy in third trimester    Plan: Discharge home in stable condition per consult w/ Dr. Gaynell Face.  Labor precautions and fetal kick counts  Follow-up Information    Follow up with Kathreen Cosier, MD On 11/22/2015.   Specialty:  Obstetrics and Gynecology   Why:  at 1:00   Contact information:   [address] [place] [address] [phone]       Follow up with [facility] [place] MATERNITY ADMISSIONS.   Why:  As needed if symptoms worsen   Contact information:   [address] [id number] mc [place] [address] [phone]          Follow-up Information    Follow up with Kathreen Cosier, MD On 11/22/2015.   Specialty:  Obstetrics and Gynecology   Why:  at 1:00   Contact information:   [address] [employer] [place] [address] [phone]       Follow up with [facility] [place] MATERNITY ADMISSIONS.   Why:  As needed if symptoms worsen   Contact information:   [address] [id number] mc [place] [address] [phone]        Medication List    STOP taking these medications        cephALEXin 500 MG capsule  Commonly known as:  KEFLEX     oxyCODONE-acetaminophen 5-325 MG tablet  Commonly known as:  PERCOCET/ROXICET     promethazine 25 MG tablet  Commonly known as:  PHENERGAN      TAKE these medications        IRON PO  Take by mouth.     PNV PO  Take by mouth.        [place], CNM 11/20/2015 1:21 PM

## 2015-11-20 NOTE — MAU Note (Signed)
Decreased FM since "yesterday night."

## 2015-11-20 NOTE — Discharge Instructions (Signed)
Fetal Movement Counts  Patient Name: __________________________________________________ Patient Due Date: ____________________  Performing a fetal movement count is highly recommended in high-risk pregnancies, but it is good for every pregnant woman to do. Your health care provider may ask you to start counting fetal movements at 28 weeks of the pregnancy. Fetal movements often increase:  · After eating a full meal.  · After physical activity.  · After eating or drinking something sweet or cold.  · At rest.  Pay attention to when you feel the baby is most active. This will help you notice a pattern of your baby's sleep and wake cycles and what factors contribute to an increase in fetal movement. It is important to perform a fetal movement count at the same time each day when your baby is normally most active.   HOW TO COUNT FETAL MOVEMENTS  1. Find a quiet and comfortable area to sit or lie down on your left side. Lying on your left side provides the best blood and oxygen circulation to your baby.  2. Write down the day and time on a sheet of paper or in a journal.  3. Start counting kicks, flutters, swishes, rolls, or jabs in a 2-hour period. You should feel at least 10 movements within 2 hours.  4. If you do not feel 10 movements in 2 hours, wait 2-3 hours and count again. Look for a change in the pattern or not enough counts in 2 hours.  SEEK MEDICAL CARE IF:  · You feel less than 10 counts in 2 hours, tried twice.  · There is no movement in over an hour.  · The pattern is changing or taking longer each day to reach 10 counts in 2 hours.  · You feel the baby is not moving as he or she usually does.  Date: ____________ Movements: ____________ Start time: ____________ Finish time: ____________   Date: ____________ Movements: ____________ Start time: ____________ Finish time: ____________  Date: ____________ Movements: ____________ Start time: ____________ Finish time: ____________  Date: ____________ Movements:  ____________ Start time: ____________ Finish time: ____________  Date: ____________ Movements: ____________ Start time: ____________ Finish time: ____________  Date: ____________ Movements: ____________ Start time: ____________ Finish time: ____________  Date: ____________ Movements: ____________ Start time: ____________ Finish time: ____________  Date: ____________ Movements: ____________ Start time: ____________ Finish time: ____________   Date: ____________ Movements: ____________ Start time: ____________ Finish time: ____________  Date: ____________ Movements: ____________ Start time: ____________ Finish time: ____________  Date: ____________ Movements: ____________ Start time: ____________ Finish time: ____________  Date: ____________ Movements: ____________ Start time: ____________ Finish time: ____________  Date: ____________ Movements: ____________ Start time: ____________ Finish time: ____________  Date: ____________ Movements: ____________ Start time: ____________ Finish time: ____________  Date: ____________ Movements: ____________ Start time: ____________ Finish time: ____________   Date: ____________ Movements: ____________ Start time: ____________ Finish time: ____________  Date: ____________ Movements: ____________ Start time: ____________ Finish time: ____________  Date: ____________ Movements: ____________ Start time: ____________ Finish time: ____________  Date: ____________ Movements: ____________ Start time: ____________ Finish time: ____________  Date: ____________ Movements: ____________ Start time: ____________ Finish time: ____________  Date: ____________ Movements: ____________ Start time: ____________ Finish time: ____________  Date: ____________ Movements: ____________ Start time: ____________ Finish time: ____________   Date: ____________ Movements: ____________ Start time: ____________ Finish time: ____________  Date: ____________ Movements: ____________ Start time: ____________ Finish  time: ____________  Date: ____________ Movements: ____________ Start time: ____________ Finish time: ____________  Date: ____________ Movements: ____________ Start time:   ____________ Finish time: ____________  Date: ____________ Movements: ____________ Start time: ____________ Finish time: ____________  Date: ____________ Movements: ____________ Start time: ____________ Finish time: ____________  Date: ____________ Movements: ____________ Start time: ____________ Finish time: ____________   Date: ____________ Movements: ____________ Start time: ____________ Finish time: ____________  Date: ____________ Movements: ____________ Start time: ____________ Finish time: ____________  Date: ____________ Movements: ____________ Start time: ____________ Finish time: ____________  Date: ____________ Movements: ____________ Start time: ____________ Finish time: ____________  Date: ____________ Movements: ____________ Start time: ____________ Finish time: ____________  Date: ____________ Movements: ____________ Start time: ____________ Finish time: ____________  Date: ____________ Movements: ____________ Start time: ____________ Finish time: ____________   Date: ____________ Movements: ____________ Start time: ____________ Finish time: ____________  Date: ____________ Movements: ____________ Start time: ____________ Finish time: ____________  Date: ____________ Movements: ____________ Start time: ____________ Finish time: ____________  Date: ____________ Movements: ____________ Start time: ____________ Finish time: ____________  Date: ____________ Movements: ____________ Start time: ____________ Finish time: ____________  Date: ____________ Movements: ____________ Start time: ____________ Finish time: ____________  Date: ____________ Movements: ____________ Start time: ____________ Finish time: ____________   Date: ____________ Movements: ____________ Start time: ____________ Finish time: ____________  Date: ____________  Movements: ____________ Start time: ____________ Finish time: ____________  Date: ____________ Movements: ____________ Start time: ____________ Finish time: ____________  Date: ____________ Movements: ____________ Start time: ____________ Finish time: ____________  Date: ____________ Movements: ____________ Start time: ____________ Finish time: ____________  Date: ____________ Movements: ____________ Start time: ____________ Finish time: ____________  Date: ____________ Movements: ____________ Start time: ____________ Finish time: ____________   Date: ____________ Movements: ____________ Start time: ____________ Finish time: ____________  Date: ____________ Movements: ____________ Start time: ____________ Finish time: ____________  Date: ____________ Movements: ____________ Start time: ____________ Finish time: ____________  Date: ____________ Movements: ____________ Start time: ____________ Finish time: ____________  Date: ____________ Movements: ____________ Start time: ____________ Finish time: ____________  Date: ____________ Movements: ____________ Start time: ____________ Finish time: ____________     This information is not intended to replace advice given to you by your health care provider. Make sure you discuss any questions you have with your health care provider.     Document Released: 11/05/2006 Document Revised: 10/27/2014 Document Reviewed: 08/02/2012  Elsevier Interactive Patient Education ©2016 [employer]

## 2015-11-27 ENCOUNTER — Encounter (HOSPITAL_COMMUNITY): Payer: Self-pay

## 2015-11-27 ENCOUNTER — Inpatient Hospital Stay (HOSPITAL_COMMUNITY)
Admission: AD | Admit: 2015-11-27 | Discharge: 2015-12-01 | DRG: 766 | Disposition: A | Discharge status: Home / Self Care | Payer: Medicaid Other | Source: Ambulatory Visit | Attending: Obstetrics | Admitting: Obstetrics

## 2015-11-27 DIAGNOSIS — K219 Gastro-esophageal reflux disease without esophagitis: Secondary | ICD-10-CM | POA: Diagnosis present

## 2015-11-27 DIAGNOSIS — Z6839 Body mass index (BMI) 39.0-39.9, adult: Secondary | ICD-10-CM | POA: Diagnosis not present

## 2015-11-27 DIAGNOSIS — O3663X0 Maternal care for excessive fetal growth, third trimester, not applicable or unspecified: Secondary | ICD-10-CM | POA: Diagnosis present

## 2015-11-27 DIAGNOSIS — O99214 Obesity complicating childbirth: Secondary | ICD-10-CM | POA: Diagnosis present

## 2015-11-27 DIAGNOSIS — O9962 Diseases of the digestive system complicating childbirth: Secondary | ICD-10-CM | POA: Diagnosis present

## 2015-11-27 DIAGNOSIS — Z3A40 40 weeks gestation of pregnancy: Secondary | ICD-10-CM

## 2015-11-27 DIAGNOSIS — Z98891 History of uterine scar from previous surgery: Secondary | ICD-10-CM

## 2015-11-27 LAB — CBC
HEMATOCRIT: 39.3 % (ref 36.0–46.0)
HEMOGLOBIN: 13.3 g/dL (ref 12.0–15.0)
MCH: 28.1 pg (ref 26.0–34.0)
MCHC: 33.8 g/dL (ref 30.0–36.0)
MCV: 83.1 fL (ref 78.0–100.0)
Platelets: 246 10*3/uL (ref 150–400)
RBC: 4.73 MIL/uL (ref 3.87–5.11)
RDW: 18.9 % — AB (ref 11.5–15.5)
WBC: 7.2 10*3/uL (ref 4.0–10.5)

## 2015-11-27 LAB — POCT FERN TEST: POCT FERN TEST: POSITIVE

## 2015-11-27 MED ORDER — ACETAMINOPHEN 325 MG PO TABS: 650.0000 mg | ORAL_TABLET | ORAL | Status: DC | PRN

## 2015-11-27 MED ORDER — LACTATED RINGERS IV SOLN
INTRAVENOUS | Status: DC
  Administered 2015-11-27 – 2015-11-28 (×6): via INTRAVENOUS

## 2015-11-27 MED ORDER — LIDOCAINE HCL (PF) 1 % IJ SOLN
30.0000 mL | INTRAMUSCULAR | Status: DC | PRN
  Filled 2015-11-27: qty 30

## 2015-11-27 MED ORDER — ONDANSETRON HCL 4 MG/2ML IJ SOLN
4.0000 mg | Freq: Four times a day (QID) | INTRAMUSCULAR | Status: DC | PRN
  Administered 2015-11-28: 4 mg via INTRAVENOUS

## 2015-11-27 MED ORDER — OXYTOCIN 10 UNIT/ML IJ SOLN
2.5000 [IU]/h | INTRAVENOUS | Status: DC
  Filled 2015-11-27: qty 4

## 2015-11-27 MED ORDER — BUTORPHANOL TARTRATE 1 MG/ML IJ SOLN
INTRAMUSCULAR | Status: AC
  Filled 2015-11-27: qty 1

## 2015-11-27 MED ORDER — LACTATED RINGERS IV SOLN
500.0000 mL | INTRAVENOUS | Status: DC | PRN
  Administered 2015-11-28: 500 mL via INTRAVENOUS

## 2015-11-27 MED ORDER — BUTORPHANOL TARTRATE 1 MG/ML IJ SOLN: 1.0000 mg | INTRAMUSCULAR | Status: DC | PRN

## 2015-11-27 MED ORDER — BUTORPHANOL TARTRATE 1 MG/ML IJ SOLN
1.0000 mg | INTRAMUSCULAR | Status: DC | PRN
  Administered 2015-11-27 – 2015-11-28 (×2): 1 mg via INTRAVENOUS
  Filled 2015-11-27: qty 1

## 2015-11-27 MED ORDER — OXYCODONE-ACETAMINOPHEN 5-325 MG PO TABS: 2.0000 | ORAL_TABLET | ORAL | Status: DC | PRN

## 2015-11-27 MED ORDER — OXYCODONE-ACETAMINOPHEN 5-325 MG PO TABS
1.0000 | ORAL_TABLET | ORAL | Status: DC | PRN
Start: 2015-11-27 — End: 2015-12-01
  Administered 2015-11-30 – 2015-12-01 (×6): 1 via ORAL
  Filled 2015-11-27 (×6): qty 1

## 2015-11-27 MED ORDER — CITRIC ACID-SODIUM CITRATE 334-500 MG/5ML PO SOLN
30.0000 mL | ORAL | Status: DC | PRN
  Administered 2015-11-28: 30 mL via ORAL
  Filled 2015-11-27: qty 15

## 2015-11-27 MED ORDER — FLEET ENEMA 7-19 GM/118ML RE ENEM: 1.0000 | ENEMA | RECTAL | Status: DC | PRN

## 2015-11-27 MED ORDER — OXYTOCIN BOLUS FROM INFUSION: 500.0000 mL | INTRAVENOUS | Status: DC

## 2015-11-27 NOTE — MAU Note (Signed)
Pt reports leaking fluid since 7 am, contractions this pm

## 2015-11-28 ENCOUNTER — Encounter (HOSPITAL_COMMUNITY): Payer: Self-pay | Admitting: Anesthesiology

## 2015-11-28 ENCOUNTER — Encounter (HOSPITAL_COMMUNITY)
Admission: AD | Disposition: A | Discharge status: Home / Self Care | Payer: Self-pay | Source: Ambulatory Visit | Attending: Obstetrics

## 2015-11-28 ENCOUNTER — Inpatient Hospital Stay (HOSPITAL_COMMUNITY): Payer: Medicaid Other | Admitting: Anesthesiology

## 2015-11-28 LAB — TYPE AND SCREEN
ABO/RH(D): AB POS
Antibody Screen: NEGATIVE

## 2015-11-28 LAB — RPR: RPR Ser Ql: NONREACTIVE

## 2015-11-28 SURGERY — Surgical Case
Anesthesia: Epidural

## 2015-11-28 MED ORDER — LIDOCAINE HCL (PF) 1 % IJ SOLN
INTRAMUSCULAR | Status: DC | PRN
  Administered 2015-11-28: 4 mL via EPIDURAL
  Administered 2015-11-28: 3 mL via EPIDURAL

## 2015-11-28 MED ORDER — WITCH HAZEL-GLYCERIN EX PADS: 1.0000 "application " | MEDICATED_PAD | CUTANEOUS | Status: DC | PRN

## 2015-11-28 MED ORDER — DIPHENHYDRAMINE HCL 50 MG/ML IJ SOLN: 12.5000 mg | INTRAMUSCULAR | Status: DC | PRN

## 2015-11-28 MED ORDER — PROMETHAZINE HCL 25 MG/ML IJ SOLN: 6.2500 mg | INTRAMUSCULAR | Status: DC | PRN

## 2015-11-28 MED ORDER — TERBUTALINE SULFATE 1 MG/ML IJ SOLN: 0.2500 mg | Freq: Once | INTRAMUSCULAR | Status: DC | PRN

## 2015-11-28 MED ORDER — KETOROLAC TROMETHAMINE 30 MG/ML IJ SOLN
30.0000 mg | Freq: Four times a day (QID) | INTRAMUSCULAR | Status: AC | PRN
  Administered 2015-11-29: 30 mg via INTRAVENOUS
  Filled 2015-11-28: qty 1

## 2015-11-28 MED ORDER — ONDANSETRON HCL 4 MG/2ML IJ SOLN
INTRAMUSCULAR | Status: AC
  Filled 2015-11-28: qty 4

## 2015-11-28 MED ORDER — DEXAMETHASONE SODIUM PHOSPHATE 4 MG/ML IJ SOLN
INTRAMUSCULAR | Status: DC | PRN
  Administered 2015-11-28: 4 mg via INTRAVENOUS

## 2015-11-28 MED ORDER — DEXAMETHASONE SODIUM PHOSPHATE 4 MG/ML IJ SOLN
INTRAMUSCULAR | Status: AC
  Filled 2015-11-28: qty 1

## 2015-11-28 MED ORDER — OXYTOCIN 10 UNIT/ML IJ SOLN
INTRAMUSCULAR | Status: AC
  Filled 2015-11-28: qty 4

## 2015-11-28 MED ORDER — DIBUCAINE 1 % RE OINT
1.0000 "application " | TOPICAL_OINTMENT | RECTAL | Status: DC | PRN
  Filled 2015-11-28: qty 28

## 2015-11-28 MED ORDER — EPHEDRINE 5 MG/ML INJ: 10.0000 mg | INTRAVENOUS | Status: DC | PRN

## 2015-11-28 MED ORDER — MEPERIDINE HCL 25 MG/ML IJ SOLN: 6.2500 mg | INTRAMUSCULAR | Status: DC | PRN

## 2015-11-28 MED ORDER — SODIUM CHLORIDE 0.9 % IR SOLN
Status: DC | PRN
  Administered 2015-11-28: 1000 mL

## 2015-11-28 MED ORDER — FENTANYL 2.5 MCG/ML BUPIVACAINE 1/10 % EPIDURAL INFUSION (WH - ANES)
14.0000 mL/h | INTRAMUSCULAR | Status: DC | PRN
  Administered 2015-11-28: 11.5 mL/h via EPIDURAL
  Administered 2015-11-28 (×2): 14 mL/h via EPIDURAL
  Filled 2015-11-28 (×2): qty 125

## 2015-11-28 MED ORDER — SCOPOLAMINE 1 MG/3DAYS TD PT72
MEDICATED_PATCH | TRANSDERMAL | Status: AC
  Filled 2015-11-28: qty 1

## 2015-11-28 MED ORDER — MORPHINE SULFATE (PF) 0.5 MG/ML IJ SOLN
INTRAMUSCULAR | Status: DC | PRN
  Administered 2015-11-28: 3 mg via EPIDURAL
  Administered 2015-11-28: 2 mg via INTRAVENOUS

## 2015-11-28 MED ORDER — OXYTOCIN 10 UNIT/ML IJ SOLN
40.0000 [IU] | INTRAMUSCULAR | Status: DC | PRN
  Administered 2015-11-28: 40 [IU] via INTRAVENOUS

## 2015-11-28 MED ORDER — OXYTOCIN 10 UNIT/ML IJ SOLN
1.0000 m[IU]/min | INTRAVENOUS | Status: DC
  Administered 2015-11-28: 2 m[IU]/min via INTRAVENOUS

## 2015-11-28 MED ORDER — CEFAZOLIN SODIUM-DEXTROSE 2-3 GM-% IV SOLR
INTRAVENOUS | Status: AC
  Filled 2015-11-28: qty 50

## 2015-11-28 MED ORDER — SODIUM BICARBONATE 8.4 % IV SOLN
INTRAVENOUS | Status: DC | PRN
  Administered 2015-11-28 (×2): 5 mL via EPIDURAL
  Administered 2015-11-28: 3 mL via EPIDURAL

## 2015-11-28 MED ORDER — MORPHINE SULFATE (PF) 0.5 MG/ML IJ SOLN
INTRAMUSCULAR | Status: AC
  Filled 2015-11-28: qty 10

## 2015-11-28 MED ORDER — KETOROLAC TROMETHAMINE 30 MG/ML IJ SOLN: 30.0000 mg | Freq: Four times a day (QID) | INTRAMUSCULAR | Status: AC | PRN

## 2015-11-28 MED ORDER — PHENYLEPHRINE 40 MCG/ML (10ML) SYRINGE FOR IV PUSH (FOR BLOOD PRESSURE SUPPORT)
80.0000 ug | PREFILLED_SYRINGE | INTRAVENOUS | Status: DC | PRN
  Filled 2015-11-28: qty 20

## 2015-11-28 MED ORDER — SCOPOLAMINE 1 MG/3DAYS TD PT72
MEDICATED_PATCH | TRANSDERMAL | Status: DC | PRN
  Administered 2015-11-28: 1 via TRANSDERMAL

## 2015-11-28 MED ORDER — LIDOCAINE-EPINEPHRINE (PF) 2 %-1:200000 IJ SOLN
INTRAMUSCULAR | Status: AC
Start: 2015-11-28 — End: 2015-11-28
  Filled 2015-11-28: qty 20

## 2015-11-28 MED ORDER — HYDROMORPHONE HCL 1 MG/ML IJ SOLN: 0.2500 mg | INTRAMUSCULAR | Status: DC | PRN

## 2015-11-28 SURGICAL SUPPLY — 34 items
CLAMP CORD UMBIL (MISCELLANEOUS) IMPLANT
CLOTH BEACON ORANGE TIMEOUT ST (SAFETY) ×3 IMPLANT
DRSG OPSITE POSTOP 4X10 (GAUZE/BANDAGES/DRESSINGS) ×3 IMPLANT
DURAPREP 26ML APPLICATOR (WOUND CARE) ×3 IMPLANT
ELECT REM PT RETURN 9FT ADLT (ELECTROSURGICAL) ×3
ELECTRODE REM PT RTRN 9FT ADLT (ELECTROSURGICAL) ×1 IMPLANT
EXTRACTOR VACUUM M CUP 4 TUBE (SUCTIONS) IMPLANT
EXTRACTOR VACUUM M CUP 4' TUBE (SUCTIONS)
GLOVE BIO SURGEON STRL SZ8 (GLOVE) ×6 IMPLANT
GLOVE BIO SURGEON STRL SZ8.5 (GLOVE) ×3 IMPLANT
GLOVE BIOGEL PI IND STRL 7.0 (GLOVE) ×2 IMPLANT
GLOVE BIOGEL PI INDICATOR 7.0 (GLOVE) ×4
GOWN STRL REUS W/TWL 2XL LVL3 (GOWN DISPOSABLE) ×9 IMPLANT
GOWN STRL REUS W/TWL LRG LVL3 (GOWN DISPOSABLE) ×3 IMPLANT
KIT ABG SYR 3ML LUER SLIP (SYRINGE) IMPLANT
NEEDLE HYPO 25X5/8 SAFETYGLIDE (NEEDLE) IMPLANT
NS IRRIG 1000ML POUR BTL (IV SOLUTION) ×3 IMPLANT
PACK C SECTION WH (CUSTOM PROCEDURE TRAY) ×3 IMPLANT
PAD OB MATERNITY 4.3X12.25 (PERSONAL CARE ITEMS) ×3 IMPLANT
PENCIL SMOKE EVAC W/HOLSTER (ELECTROSURGICAL) ×3 IMPLANT
STAPLER VISISTAT 35W (STAPLE) ×3 IMPLANT
SUT CHROMIC 0 CT 802H (SUTURE) ×6 IMPLANT
SUT CHROMIC 0 MO4 CR (SUTURE) IMPLANT
SUT CHROMIC 1 CTX 36 (SUTURE) ×6 IMPLANT
SUT CHROMIC 2 0 SH (SUTURE) ×3 IMPLANT
SUT GUT PLAIN 0 CT-3 TAN 27 (SUTURE) IMPLANT
SUT MON AB 4-0 PS1 27 (SUTURE) ×3 IMPLANT
SUT PDS AB 0 CTX 36 PDP370T (SUTURE) IMPLANT
SUT VIC AB 0 CT1 18XCR BRD8 (SUTURE) IMPLANT
SUT VIC AB 0 CT1 8-18 (SUTURE)
SUT VIC AB 0 CTX 36 (SUTURE) ×6
SUT VIC AB 0 CTX36XBRD ANBCTRL (SUTURE) ×3 IMPLANT
TOWEL OR 17X24 6PK STRL BLUE (TOWEL DISPOSABLE) ×3 IMPLANT
TRAY FOLEY CATH SILVER 14FR (SET/KITS/TRAYS/PACK) ×3 IMPLANT

## 2015-11-28 NOTE — Op Note (Signed)
Preop diagnosis patient fully dilated and pushing mother once C-section Postop diagnosis fetal macrosomia Surgeon Dr. Francoise Ceo First assistant Dr. Coral Ceo Procedure patient placed in the supine position abdomen prepped and draped bladder emptied with a Foley catheter a transverse suprapubic incision made carried   to the rectus fascia fascia cleaned and incised length of the incision recti muscles retracted laterally peritoneum incised longitudinally transverse incision made on the visceroperitoneum above the bladder bladder mobilized inferiorly transverse low uterine incision made patient delivered from the LOA position of a female Apgar 6 and 8 placenta was fundal removed manually uterine cavity clean with dry laps placenta sent to labor and delivery uterine incision closed in 2 layers with continuous   chromic hemostasis satisfactory bladder flap reattached with  chromic lap and sponge counts correct abdomen closed in layers peritoneum continuous with of 0 chromic fascia continuous with  0 dexon  on skin closed with skin clips blood last 900 cc patient tolerated procedure well

## 2015-11-28 NOTE — Anesthesia Postprocedure Evaluation (Signed)
Anesthesia Post Note  Patient: Valerie Hansen  Procedure(s) Performed: Procedure(s) (LRB): CESAREAN SECTION (N/A)  Patient location during evaluation: PACU Anesthesia Type: Epidural Level of consciousness: oriented and awake and alert Pain management: pain level controlled Vital Signs Assessment: post-procedure vital signs reviewed and stable Respiratory status: spontaneous breathing, respiratory function stable and patient connected to nasal cannula oxygen Cardiovascular status: blood pressure returned to baseline and stable Postop Assessment: no headache, no backache and epidural receding Anesthetic complications: no    Last Vitals:  Filed Vitals:   11/28/15 2300 11/28/15 2315  BP: 108/52 109/72  Pulse: 90 87  Temp: 36.7 C   Resp: 15 26    Last Pain:  Filed Vitals:   11/28/15 2325  PainSc: 0-No pain                 Valerie Hansen J

## 2015-11-28 NOTE — Transfer of Care (Signed)
Immediate Anesthesia Transfer of Care Note  Patient: Valerie Hansen  Procedure(s) Performed: Procedure(s): CESAREAN SECTION (N/A)  Patient Location: PACU  Anesthesia Type:Epidural  Level of Consciousness: awake, alert  and oriented  Airway & Oxygen Therapy: Patient Spontanous Breathing  Post-op Assessment: Report given to RN and Post -op Vital signs reviewed and stable  Post vital signs: Reviewed and stable  Last Vitals:  Filed Vitals:   11/28/15 1900 11/28/15 1930  BP: 113/60 105/77  Pulse: 77 91  Temp: 37.2 C 37.3 C  Resp: 16 18    Complications: No apparent anesthesia complications

## 2015-11-28 NOTE — Anesthesia Preprocedure Evaluation (Addendum)
Anesthesia Evaluation  Patient identified by MRN, date of birth, ID band Patient awake    Reviewed: Allergy & Precautions, NPO status , Patient's Chart, lab work & pertinent test results  Airway Mallampati: III  TM Distance: >3 FB Neck ROM: Full    Dental no notable dental hx. (+) Teeth Intact   Pulmonary neg pulmonary ROS,    Pulmonary exam normal breath sounds clear to auscultation       Cardiovascular negative cardio ROS Normal cardiovascular exam Rhythm:Regular Rate:Normal     Neuro/Psych negative neurological ROS  negative psych ROS   GI/Hepatic Neg liver ROS, GERD  ,  Endo/Other  Morbid obesity  Renal/GU negative Renal ROS  negative genitourinary   Musculoskeletal negative musculoskeletal ROS (+)   Abdominal (+) + obese,   Peds  Hematology negative hematology ROS (+)   Anesthesia Other Findings   Reproductive/Obstetrics (+) Pregnancy                             Anesthesia Physical Anesthesia Plan  ASA: III  Anesthesia Plan: Epidural   Post-op Pain Management:    Induction:   Airway Management Planned: Natural Airway  Additional Equipment:   Intra-op Plan:   Post-operative Plan:   Informed Consent: I have reviewed the patients History and Physical, chart, labs and discussed the procedure including the risks, benefits and alternatives for the proposed anesthesia with the patient or authorized representative who has indicated his/her understanding and acceptance.     Plan Discussed with: Anesthesiologist  Anesthesia Plan Comments: (2145: Decision made to go to CD. Epidural has been working. Plan to dose up epidural for CD.)       Anesthesia Quick Evaluation

## 2015-11-28 NOTE — Anesthesia Procedure Notes (Signed)
Epidural Patient location during procedure: OB Start time: 11/28/2015 5:47 AM  Staffing Anesthesiologist: Mal Amabile  Preanesthetic Checklist Completed: patient identified, site marked, surgical consent, pre-op evaluation, timeout performed, IV checked, risks and benefits discussed and monitors and equipment checked  Epidural Patient position: sitting Prep: site prepped and draped and DuraPrep Patient monitoring: continuous pulse ox and blood pressure Approach: midline Location: L3-L4 Injection technique: LOR air  Needle:  Needle type: Tuohy  Needle gauge: 17 G Needle length: 9 cm and 9 Needle insertion depth: 8 cm Catheter type: closed end flexible Catheter size: 19 Gauge Catheter at skin depth: 13 cm Test dose: negative and Other  Assessment Events: blood not aspirated, injection not painful, no injection resistance, negative IV test and no paresthesia  Additional Notes Patient identified. Risks and benefits discussed including failed block, incomplete  Pain control, post dural puncture headache, nerve damage, paralysis, blood pressure Changes, nausea, vomiting, reactions to medications-both toxic and allergic and post Partum back pain. All questions were answered. Patient expressed understanding and wished to proceed. Sterile technique was used throughout procedure. Epidural site was Dressed with sterile barrier dressing. No paresthesias, signs of intravascular injection Or signs of intrathecal spread were encountered.  Patient was more comfortable after the epidural was dosed. Please see RN's note for documentation of vital signs and FHR which are stable.

## 2015-11-28 NOTE — Progress Notes (Signed)
Patient ID: Valerie Hansen, female   DOB: Aug 21, 1981, 35 y.o.   MRN: [id number] Patient has made slow progress and she is no fully dilated and +2 station was a lot of molding pushed for now and the patient says that she's not push  Ing  Anymore  And wants  to have a C-section

## 2015-11-28 NOTE — H&P (Signed)
This is Dr. Francoise Ceo dictating the history and physical on  Valerie Hansen  she is a 35 year old gravida 2 para 0020 EDC to 717 negative GBS and she was admitted in labor 4 cm 90% vertex -2-3 she is now 6 cm 90% vertex -2-3 forewaters ruptured and the fluid distended meconium Past medical history negative Past surgical history negative Social history negative System review negative Physical exam well-developed female in labor HEENT negative Lungs clear to P&A Heart regular rhythm no murmurs no gallops Breasts negative Abdomen term estimated fetal weight 8 pounds Pelvic as described above Extremities negative

## 2015-11-29 ENCOUNTER — Encounter (HOSPITAL_COMMUNITY): Payer: Self-pay | Admitting: Obstetrics

## 2015-11-29 DIAGNOSIS — Z98891 History of uterine scar from previous surgery: Secondary | ICD-10-CM

## 2015-11-29 LAB — CBC
HEMATOCRIT: 32.7 % — AB (ref 36.0–46.0)
Hemoglobin: 10.8 g/dL — ABNORMAL LOW (ref 12.0–15.0)
MCH: 27.9 pg (ref 26.0–34.0)
MCHC: 33 g/dL (ref 30.0–36.0)
MCV: 84.5 fL (ref 78.0–100.0)
Platelets: 238 10*3/uL (ref 150–400)
RBC: 3.87 MIL/uL (ref 3.87–5.11)
RDW: 18.8 % — AB (ref 11.5–15.5)
WBC: 17.3 10*3/uL — ABNORMAL HIGH (ref 4.0–10.5)

## 2015-11-29 MED ORDER — SCOPOLAMINE 1 MG/3DAYS TD PT72
1.0000 | MEDICATED_PATCH | Freq: Once | TRANSDERMAL | Status: DC
  Administered 2015-11-29: 1.5 mg via TRANSDERMAL
  Filled 2015-11-29: qty 1

## 2015-11-29 MED ORDER — LACTATED RINGERS IV SOLN
INTRAVENOUS | Status: DC
  Administered 2015-11-29: 10:00:00 via INTRAVENOUS

## 2015-11-29 MED ORDER — ZOLPIDEM TARTRATE 5 MG PO TABS
5.0000 mg | ORAL_TABLET | Freq: Every evening | ORAL | Status: DC | PRN
Start: 2015-11-29 — End: 2015-12-01

## 2015-11-29 MED ORDER — MEASLES, MUMPS & RUBELLA VAC ~~LOC~~ INJ
0.5000 mL | INJECTION | Freq: Once | SUBCUTANEOUS | Status: DC
  Filled 2015-11-29: qty 0.5

## 2015-11-29 MED ORDER — LANOLIN HYDROUS EX OINT: 1.0000 "application " | TOPICAL_OINTMENT | CUTANEOUS | Status: DC | PRN

## 2015-11-29 MED ORDER — ONDANSETRON HCL 4 MG/2ML IJ SOLN: 4.0000 mg | Freq: Three times a day (TID) | INTRAMUSCULAR | Status: DC | PRN

## 2015-11-29 MED ORDER — SIMETHICONE 80 MG PO CHEW
80.0000 mg | CHEWABLE_TABLET | Freq: Three times a day (TID) | ORAL | Status: DC
  Administered 2015-11-29 – 2015-12-01 (×8): 80 mg via ORAL
  Filled 2015-11-29 (×8): qty 1

## 2015-11-29 MED ORDER — SENNOSIDES-DOCUSATE SODIUM 8.6-50 MG PO TABS
2.0000 | ORAL_TABLET | ORAL | Status: DC
  Administered 2015-11-30 (×2): 2 via ORAL
  Filled 2015-11-29 (×2): qty 2

## 2015-11-29 MED ORDER — TETANUS-DIPHTH-ACELL PERTUSSIS 5-2.5-18.5 LF-MCG/0.5 IM SUSP
0.5000 mL | Freq: Once | INTRAMUSCULAR | Status: AC
  Administered 2015-11-30: 0.5 mL via INTRAMUSCULAR

## 2015-11-29 MED ORDER — NALOXONE HCL 0.4 MG/ML IJ SOLN: 0.4000 mg | INTRAMUSCULAR | Status: DC | PRN

## 2015-11-29 MED ORDER — PRENATAL MULTIVITAMIN CH
1.0000 | ORAL_TABLET | Freq: Every day | ORAL | Status: DC
  Administered 2015-11-29 – 2015-12-01 (×3): 1 via ORAL
  Filled 2015-11-29 (×3): qty 1

## 2015-11-29 MED ORDER — IBUPROFEN 600 MG PO TABS
600.0000 mg | ORAL_TABLET | Freq: Four times a day (QID) | ORAL | Status: DC
  Administered 2015-11-29 – 2015-12-01 (×9): 600 mg via ORAL
  Filled 2015-11-29 (×9): qty 1

## 2015-11-29 MED ORDER — NALBUPHINE HCL 10 MG/ML IJ SOLN: 5.0000 mg | Freq: Once | INTRAMUSCULAR | Status: DC | PRN

## 2015-11-29 MED ORDER — OXYTOCIN 10 UNIT/ML IJ SOLN: 2.5000 [IU]/h | INTRAVENOUS | Status: AC

## 2015-11-29 MED ORDER — SIMETHICONE 80 MG PO CHEW: 80.0000 mg | CHEWABLE_TABLET | ORAL | Status: DC | PRN

## 2015-11-29 MED ORDER — SODIUM CHLORIDE 0.9% FLUSH: 3.0000 mL | INTRAVENOUS | Status: DC | PRN

## 2015-11-29 MED ORDER — SIMETHICONE 80 MG PO CHEW
80.0000 mg | CHEWABLE_TABLET | ORAL | Status: DC
  Administered 2015-11-30 (×2): 80 mg via ORAL
  Filled 2015-11-29 (×2): qty 1

## 2015-11-29 MED ORDER — NALBUPHINE HCL 10 MG/ML IJ SOLN: 5.0000 mg | INTRAMUSCULAR | Status: DC | PRN

## 2015-11-29 MED ORDER — KETOROLAC TROMETHAMINE 30 MG/ML IJ SOLN
INTRAMUSCULAR | Status: AC
  Administered 2015-11-29: 30 mg via INTRAMUSCULAR
  Filled 2015-11-29: qty 1

## 2015-11-29 MED ORDER — MENTHOL 3 MG MT LOZG: 1.0000 | LOZENGE | OROMUCOSAL | Status: DC | PRN

## 2015-11-29 MED ORDER — NALOXONE HCL 2 MG/2ML IJ SOSY
1.0000 ug/kg/h | PREFILLED_SYRINGE | INTRAVENOUS | Status: DC | PRN
  Filled 2015-11-29: qty 2

## 2015-11-29 MED ORDER — DIPHENHYDRAMINE HCL 25 MG PO CAPS
25.0000 mg | ORAL_CAPSULE | Freq: Four times a day (QID) | ORAL | Status: DC | PRN
Start: 2015-11-29 — End: 2015-12-01

## 2015-11-29 MED ORDER — ACETAMINOPHEN 325 MG PO TABS: 650.0000 mg | ORAL_TABLET | ORAL | Status: DC | PRN

## 2015-11-29 NOTE — Lactation Note (Signed)
This note was copied from a baby's chart. Lactation Consultation Note  Mother reports that she want to BF and feed expressed BM from a bottle.  The baby ate 2 hours ago. is sleeping now and mother does not want to wake her. Explained to mother that to support her milk supply she needs to pump every 2-3 hours if the baby is not eating.  Reviewed hand expression.  Encouraged mother to call for latching assistance.  Information given on support groups and outpatient services. Patient Name: Valerie Hansen [id number] Date: 11/29/2015     Maternal Data    Feeding Feeding Type: Breast Milk Nipple Type: Slow - flow  LATCH Score/Interventions Latch: Too sleepy or reluctant, no latch achieved, no sucking elicited. Intervention(s): Skin to skin  Audible Swallowing: None Intervention(s): Skin to skin  Type of Nipple: Flat Intervention(s): Hand pump  Comfort (Breast/Nipple): Soft / non-tender     Hold (Positioning): Assistance needed to correctly position infant at breast and maintain latch. (side lying) Intervention(s): Skin to skin;Position options;Breastfeeding basics reviewed  LATCH Score: 4  Lactation Tools Discussed/Used Tools: Pump Breast pump type: Double-Electric Breast Pump   Consult Status      Valerie Hansen 11/29/2015, 3:28 PM

## 2015-11-29 NOTE — Anesthesia Postprocedure Evaluation (Signed)
Anesthesia Post Note  Patient: Valerie Hansen  Procedure(s) Performed: Procedure(s) (LRB): CESAREAN SECTION (N/A)  Patient location during evaluation: Mother Baby Anesthesia Type: Epidural Level of consciousness: awake and alert Pain management: satisfactory to patient Vital Signs Assessment: post-procedure vital signs reviewed and stable Respiratory status: spontaneous breathing Cardiovascular status: stable Postop Assessment: no signs of nausea or vomiting and adequate PO intake Anesthetic complications: no    Last Vitals:  Filed Vitals:   11/29/15 0200 11/29/15 0605  BP: 100/68 99/65  Pulse: 84 88  Temp: 37.2 C 37 C  Resp: 20 18    Last Pain:  Filed Vitals:   11/29/15 0606  PainSc: 0-No pain                 Sameera Betton Hristova

## 2015-11-29 NOTE — Lactation Note (Signed)
This note was copied from a baby'Hansen chart. Lactation Consultation Note  Mom called out for latch assist.  She gave the baby 5 mls of expressed milk 2.5 hours ago.  Baby sleepy and waking techniques done.  Mom has thick non compressible areolar tissue and flat nipples.  Pre pumped a few minutes prior to latch attempt.  24 mm nipple shield applied with instructions to mom. Baby latched after a few minutes and nursed actively for 10 minutes.  No swallows heard or colostrum in shield after feed.  Baby sleeping after feeding.  Instructed mom to put baby to breast using nipple shield with feeding cues, pre pump before attempt and post pump with DEBP and give EBM back to baby.  Will continue to monitor output and weight to determine if formula supplementation necessary.  Patient Name: Valerie Hansen [id number] Date: 11/29/2015 Reason for consult: Follow-up assessment;Difficult latch   Maternal Data Has patient been taught Hand Expression?: Yes Does the patient have breastfeeding experience prior to this delivery?: No (first baby)  Feeding Feeding Type: Breast Fed Length of feed: 10 min  LATCH Score/Interventions Latch: Grasps breast easily, tongue down, lips flanged, rhythmical sucking. (WITH 24 MM NIPPLE SHIELD)  Audible Swallowing: None  Type of Nipple: Flat  Comfort (Breast/Nipple): Soft / non-tender     Hold (Positioning): Assistance needed to correctly position infant at breast and maintain latch. Intervention(Hansen): Breastfeeding basics reviewed;Support Pillows;Position options;Skin to skin  LATCH Score: 6  Lactation Tools Discussed/Used Tools: Nipple Shields Nipple shield size: 24   Consult Status      Valerie Hansen 11/29/2015, 6:06 PM

## 2015-11-29 NOTE — Addendum Note (Signed)
Addendum  created 11/29/15 0754 by Elgie Congo, CRNA   Modules edited: Clinical Notes   Clinical Notes:  File: [id number]

## 2015-11-29 NOTE — Progress Notes (Signed)
Patient ID: Valerie Hansen, female   DOB: 07-Mar-1981, 35 y.o.   MRN: [id number] Postop day 1 Blood pressure 99/65 pulse 88 respiration 18 afebrile Abdomen soft dressing dry Legs negative doing well

## 2015-11-29 NOTE — Progress Notes (Signed)
UR chart review completed.  

## 2015-11-30 NOTE — Clinical Documentation Improvement (Signed)
OB/GYN  Abnormal Lab/Test Results:  2//7/17: H/H= 13.3/39.3: 11/28/15: H/H= 10.8/32.7  Possible Clinical Conditions associated with below indicators  Acute Blood Loss Anemia  Other Condition  Cannot Clinically Determine   Supporting Information: C-section 11/28/15: EBL= 900 ml   Please exercise your independent, professional judgment when responding. A specific answer is not anticipated or expected.   Thank You,  Cherylann Ratel, RN, BSN Health Information Management [place] [phone]

## 2015-11-30 NOTE — Progress Notes (Signed)
Patient ID: Valerie Hansen, female   DOB: 09-06-1981, 35 y.o.   MRN: [id number] Postop day 2 Blood pressure 87/61 pulse 1:15 respiration 18 afebrile Abdomen soft Legs negative Lochia moderate no complaints

## 2015-12-01 MED ORDER — MEASLES, MUMPS & RUBELLA VAC ~~LOC~~ INJ
0.5000 mL | INJECTION | Freq: Once | SUBCUTANEOUS | Status: AC
  Administered 2015-12-01: 0.5 mL via SUBCUTANEOUS
  Filled 2015-12-01: qty 0.5

## 2015-12-01 NOTE — Discharge Summary (Signed)
Obstetric Discharge Summary Reason for Admission: onset of labor Prenatal Procedures: none Intrapartum Procedures: cesarean: low cervical, transverse Postpartum Procedures: none Complications-Operative and Postpartum: none HEMOGLOBIN  Date Value Ref Range Status  11/29/2015 10.8* 12.0 - 15.0 g/dL Final   HCT  Date Value Ref Range Status  11/29/2015 32.7* 36.0 - 46.0 % Final    Physical Exam:  General: alert Lochia: appropriate Uterine Fundus: firm Incision: healing well DVT Evaluation: No evidence of DVT seen on physical exam.  Discharge Diagnoses: Term Pregnancy-delivered  Discharge Information: Date: 12/01/2015 Activity: pelvic rest Diet: routine Medications: Percocet Condition: improved Instructions: refer to practice specific booklet Discharge to: home Follow-up Information    Follow up with Kathreen Cosier, MD.   Specialty:  Obstetrics and Gynecology   Contact information:   [address] [place] [address] [phone]       Newborn Data: Live born female  Birth Weight: 9 lb 15.1 oz (4510 g) APGAR: 6, 7  Home with mother.  MARSHALL,BERNARD A 12/01/2015, 6:01 AM

## 2015-12-01 NOTE — Discharge Instructions (Signed)
Discharge instructions   You can wash your hair  Shower  Eat what you want  Drink what you want  See me in 6 weeks  Your ankles are going to swell more in the next 2 weeks than when pregnant  No sex for 6 weeks   Ranald Alessio A, MD 12/01/2015

## 2015-12-01 NOTE — Progress Notes (Signed)
Patient ID: Valerie Hansen, female   DOB: [birth year], 35 y.o.   MRN: [id number] Postop day 3 Blood pressure 96/57 afebrile pulse 88 respirations 16 Fundus firm Lochia moderate Dressing and incision clean legs negative home today

## 2017-11-14 IMAGING — US US MFM OB DETAIL+14 WK
1 series · 12 of 28 positions shown · non-contrast
Comparison: none

[Series 1: us mfm ob detail+14 wk · 53 acquisitions, 12 frames shown]
[im 2/53]
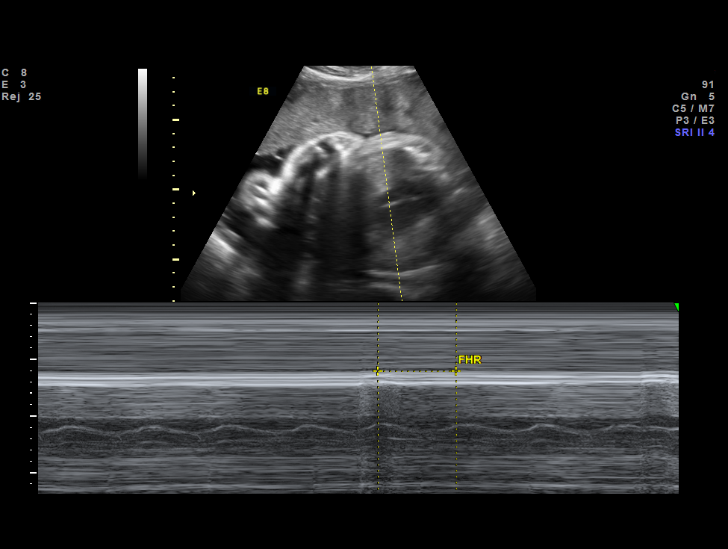
[im 6/53]
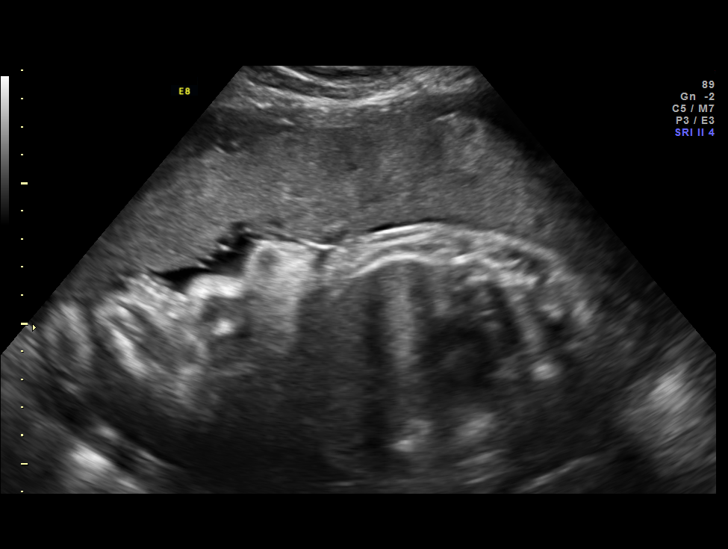
[im 10/53]
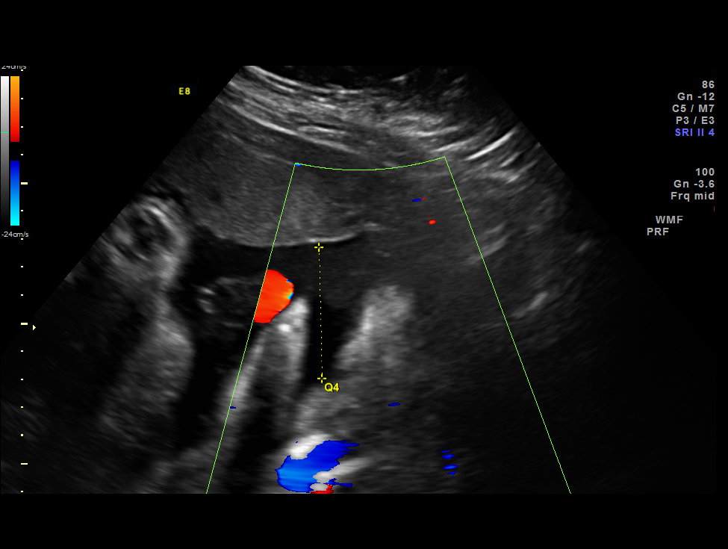
[im 16/53]
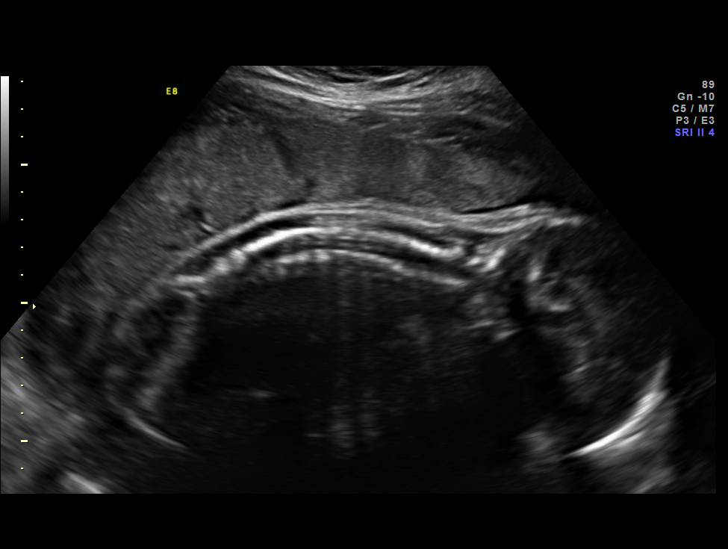
[im 20/53]
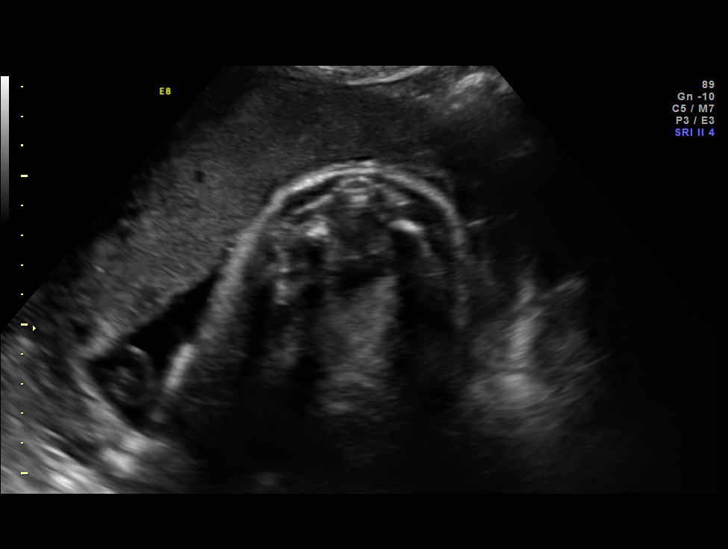
[im 24/53]
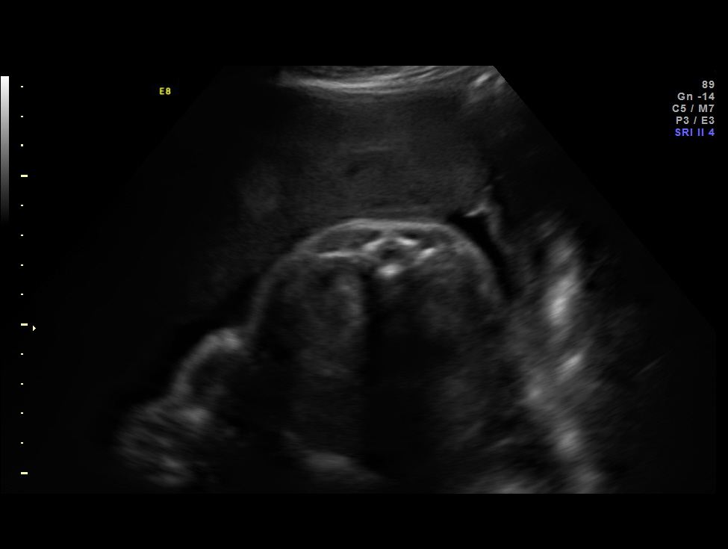
[im 29/53]
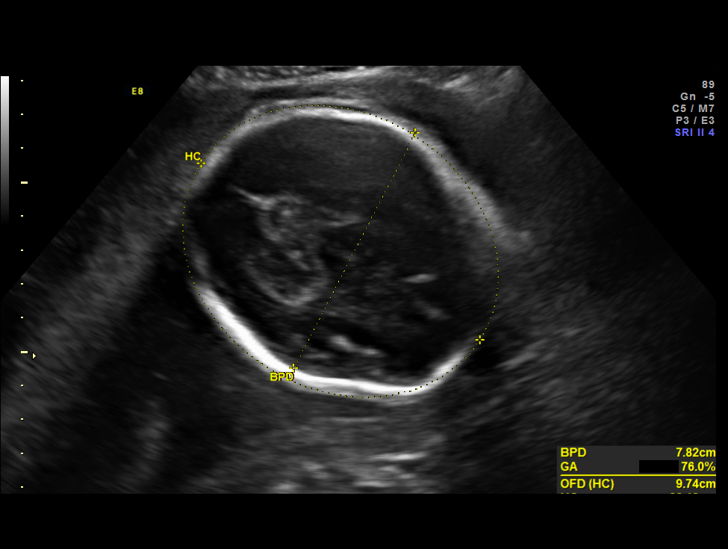
[im 33/53]
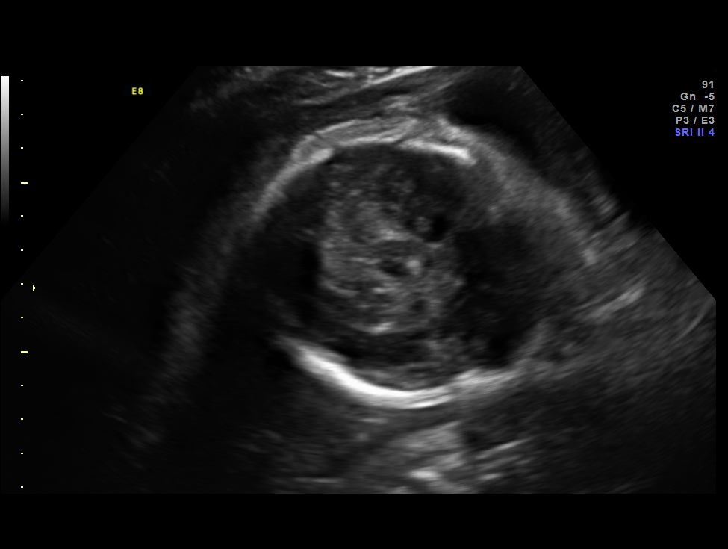
[im 37/53]
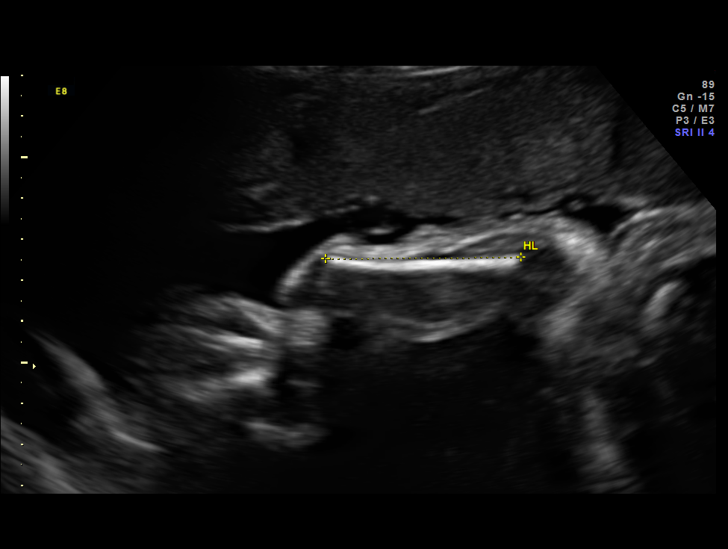
[im 43/53]
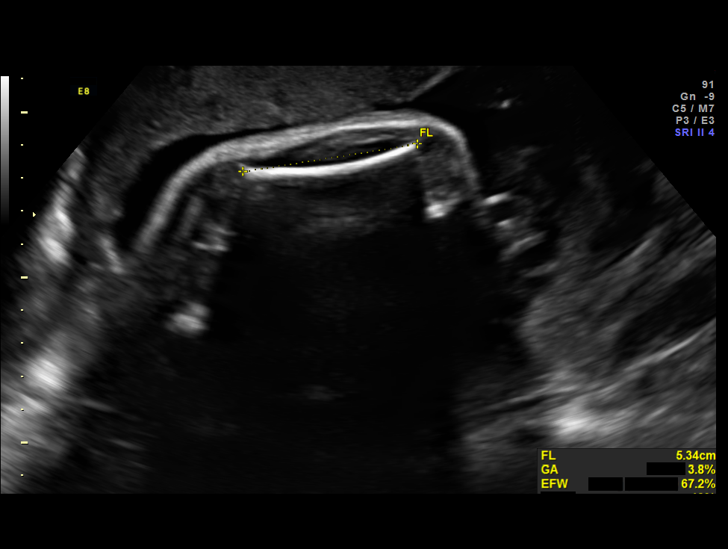
[im 47/53]
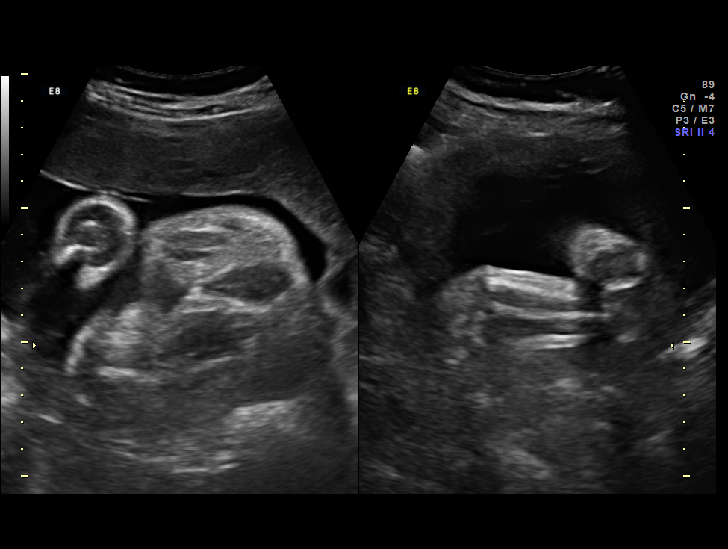
[im 51/53]
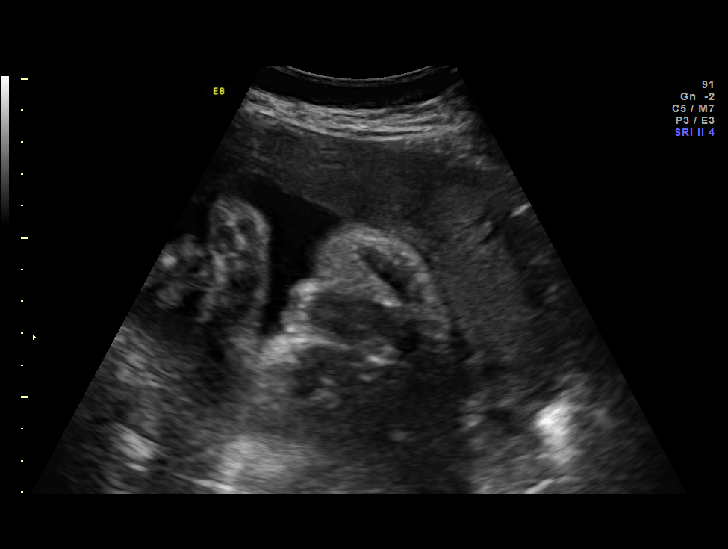

[12 of 28 positions shown; findings below may reference images not displayed]

am)

Name:       WIKI NIKI TENEROWICZ                        Visit  09/19/2015 [DATE]
Date:

Service(s) Provided

Indications

Detailed fetal anatomic survey                  Z36
Advanced maternal age multigravida 35+,
third trimester (35 by delivery)
Obesity complicating pregnancy, third
trimester
Decreased fetal movements, third
trimester, unspecified
OB History

Height:        5'1"   Weight:   193.4      BMI:
Gravidity:     2         Term:  0        Prem:    0        SAB:   1
TOP:           0       Ectopic  0        Living:  1
:
Fetal Evaluation

Num Of Fetuses:      1
Fetal Heart          132
Rate(bpm):
Cardiac Activity:    Observed
Presentation:        Cephalic
Placenta:            Anterior, above cervical os
P. Cord Insertion:   Visualized, central

Amniotic Fluid
AFI FV:      Subjectively within normal limits
AFI Sum:     18.95    cm      72  %Tile     Larg Pckt:      5.5  cm
RUQ:   5.34    cm    RLQ:   4.68    cm   LUQ:    3.43    cm   LLQ:    5.5    cm
Biometry

BPD:      79.7  mm     G. Age:   32w 0d                  CI:         81.0   %    70 - 86
OFD:      98.4  mm                                       FL/HC:      18.8   %    19.2 -
HC:        284  mm     G. Age:   31w 1d        44   %    HC/AC:      0.99        0.99 -
AC:      285.9  mm     G. Age:   32w 4d        96   %    FL/BPD      67.0   %    71 - 87
:
FL:       53.4  mm     G. Age:   28w 3d          4  %    FL/AC:      18.7   %    20 - 24
HUM:      48.1  mm     G. Age:   28w 1d        14   %
LV:        5.2  mm

Est.        5701   gm   3 lb 12 oz      72   %
FW:
Gestational Age

U/S Today:     31w 0d                                         EDD:   11/21/15
Best:          30w 1d    Det. By:   Previous Ultrasound       EDD:   11/27/15
Anatomy

Cranium:          Appears normal         Aortic Arch:       Not well visualized
Fetal Cavum:      Appears normal         Ductal Arch:       Not well visualized
Ventricles:       Appears normal         Diaphragm:         Not well visualized
Choroid Plexus:   Appears normal         Stomach:           Appears normal,
left sided
Cerebellum:       Appears normal         Abdomen:           Appears normal
Posterior         Not well visualized    Abdominal          Not well visualized
Fossa:                                   Wall:
Nuchal Fold:      Not applicable (>20    Cord Vessels:      Appears normal (3
wks GA)                                   vessel cord)
Face:             Not well visualized    Kidneys:           Appear normal
Lips:             Not well visualized    Bladder:           Appears normal
Palate:           Not well visualized    Spine:             Limited views
appear normal
Heart:            Not well visualized    Upper              Not well visualized
Extremities:
RVOT:             Not well visualized    Lower              Visualized
Extremities:
LVOT:             Not well visualized

Other:   Fetus appears to be a female. Technically difficult due to advanced
GA and fetal position. Complete fetal anatomic survey previously
performed in office.
Cervix Uterus Adnexa

Cervix
Not visualized (advanced GA >92wks)

Adnexa:       No abnormality visualized.
Impression

Singleton intrauterine pregnancy at 30 weeks 1 day
gestation with fetal cardiac activity
Cephalic presentation
Anterior placenta without evidence of previa
Normal appearing featl growth and amniotic fluid volume
NST reactive today
Recommendations

Consider repeat ultrasound in 
 4 weeks for fetal growth
